# Patient Record
Sex: Female | Born: 1990
Health system: Southern US, Community
[De-identification: ages and names within clinical notes are randomized; demographics above are authoritative.]

## PROBLEM LIST (undated history)

## (undated) DIAGNOSIS — F419 Anxiety disorder, unspecified: Secondary | ICD-10-CM

## (undated) HISTORY — DX: Anxiety disorder, unspecified: F41.9

---

## 2009-07-28 ENCOUNTER — Emergency Department: Payer: Self-pay | Admitting: Emergency Medicine

## 2009-08-05 ENCOUNTER — Emergency Department: Payer: Self-pay | Admitting: Emergency Medicine

## 2009-08-20 ENCOUNTER — Ambulatory Visit: Payer: Self-pay | Admitting: Internal Medicine

## 2009-08-22 ENCOUNTER — Ambulatory Visit: Payer: Self-pay | Admitting: Orthopedic Surgery

## 2009-09-12 ENCOUNTER — Ambulatory Visit: Payer: Self-pay | Admitting: Internal Medicine

## 2010-01-01 ENCOUNTER — Emergency Department: Payer: Self-pay | Admitting: Emergency Medicine

## 2010-11-23 ENCOUNTER — Observation Stay: Payer: Self-pay

## 2010-11-24 ENCOUNTER — Inpatient Hospital Stay: Payer: Self-pay | Admitting: Obstetrics and Gynecology

## 2011-05-13 ENCOUNTER — Emergency Department: Payer: Self-pay | Admitting: Emergency Medicine

## 2011-05-13 LAB — PREGNANCY, URINE: Pregnancy Test, Urine: NEGATIVE m[IU]/mL

## 2013-07-07 DIAGNOSIS — Z3480 Encounter for supervision of other normal pregnancy, unspecified trimester: Secondary | ICD-10-CM | POA: Insufficient documentation

## 2013-07-07 HISTORY — DX: Encounter for supervision of other normal pregnancy, unspecified trimester: Z34.80

## 2013-12-04 DIAGNOSIS — O169 Unspecified maternal hypertension, unspecified trimester: Secondary | ICD-10-CM | POA: Insufficient documentation

## 2013-12-04 HISTORY — DX: Unspecified maternal hypertension, unspecified trimester: O16.9

## 2013-12-11 ENCOUNTER — Observation Stay: Payer: Self-pay

## 2013-12-13 ENCOUNTER — Inpatient Hospital Stay: Payer: Self-pay

## 2013-12-13 LAB — GC/CHLAMYDIA PROBE AMP

## 2013-12-13 LAB — CBC WITH DIFFERENTIAL/PLATELET
Basophil #: 0.1 10*3/uL (ref 0.0–0.1)
Basophil %: 0.6 %
Eosinophil #: 0.1 10*3/uL (ref 0.0–0.7)
Eosinophil %: 0.4 %
HCT: 35.8 % (ref 35.0–47.0)
HGB: 11.7 g/dL — ABNORMAL LOW (ref 12.0–16.0)
LYMPHS ABS: 1.7 10*3/uL (ref 1.0–3.6)
Lymphocyte %: 12.6 %
MCH: 26.7 pg (ref 26.0–34.0)
MCHC: 32.6 g/dL (ref 32.0–36.0)
MCV: 82 fL (ref 80–100)
Monocyte #: 1.2 x10 3/mm — ABNORMAL HIGH (ref 0.2–0.9)
Monocyte %: 9.1 %
NEUTROS ABS: 10.2 10*3/uL — AB (ref 1.4–6.5)
Neutrophil %: 77.3 %
Platelet: 256 10*3/uL (ref 150–440)
RBC: 4.37 10*6/uL (ref 3.80–5.20)
RDW: 14 % (ref 11.5–14.5)
WBC: 13.3 10*3/uL — ABNORMAL HIGH (ref 3.6–11.0)

## 2013-12-14 LAB — HEMATOCRIT: HCT: 31.5 % — ABNORMAL LOW (ref 35.0–47.0)

## 2014-08-13 ENCOUNTER — Ambulatory Visit: Payer: BLUE CROSS/BLUE SHIELD

## 2014-08-13 ENCOUNTER — Ambulatory Visit
Admission: EM | Admit: 2014-08-13 | Discharge: 2014-08-13 | Disposition: A | Discharge status: Home / Self Care | Payer: BLUE CROSS/BLUE SHIELD | Attending: Family Medicine | Admitting: Family Medicine

## 2014-08-13 DIAGNOSIS — S29009A Unspecified injury of muscle and tendon of unspecified wall of thorax, initial encounter: Secondary | ICD-10-CM

## 2014-08-13 DIAGNOSIS — Y929 Unspecified place or not applicable: Secondary | ICD-10-CM | POA: Diagnosis not present

## 2014-08-13 DIAGNOSIS — Z79899 Other long term (current) drug therapy: Secondary | ICD-10-CM | POA: Diagnosis not present

## 2014-08-13 DIAGNOSIS — S161XXA Strain of muscle, fascia and tendon at neck level, initial encounter: Secondary | ICD-10-CM

## 2014-08-13 DIAGNOSIS — S29012A Strain of muscle and tendon of back wall of thorax, initial encounter: Secondary | ICD-10-CM | POA: Diagnosis not present

## 2014-08-13 DIAGNOSIS — M25552 Pain in left hip: Secondary | ICD-10-CM | POA: Diagnosis present

## 2014-08-13 DIAGNOSIS — Y939 Activity, unspecified: Secondary | ICD-10-CM | POA: Insufficient documentation

## 2014-08-13 DIAGNOSIS — S76012A Strain of muscle, fascia and tendon of left hip, initial encounter: Secondary | ICD-10-CM | POA: Diagnosis not present

## 2014-08-13 DIAGNOSIS — M545 Low back pain: Secondary | ICD-10-CM | POA: Diagnosis present

## 2014-08-13 DIAGNOSIS — M542 Cervicalgia: Secondary | ICD-10-CM | POA: Diagnosis present

## 2014-08-13 DIAGNOSIS — S29019A Strain of muscle and tendon of unspecified wall of thorax, initial encounter: Secondary | ICD-10-CM

## 2014-08-13 LAB — PREGNANCY, URINE: Preg Test, Ur: NEGATIVE

## 2014-08-13 MED ORDER — ACETAMINOPHEN 500 MG PO TABS: 500.0000 mg | ORAL_TABLET | Freq: Four times a day (QID) | ORAL | Status: DC | PRN
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
## 2014-08-13 NOTE — Discharge Instructions (Signed)
Hip Pain Your hip is the joint between your upper legs and your lower pelvis. The bones, cartilage, tendons, and muscles of your hip joint perform a lot of work each day supporting your body weight and allowing you to move around. Hip pain can range from a minor ache to severe pain in one or both of your hips. Pain may be felt on the inside of the hip joint near the groin, or the outside near the buttocks and upper thigh. You may have swelling or stiffness as well.  HOME CARE INSTRUCTIONS   Take medicines only as directed by your health care provider.  Apply ice to the injured area:  Put ice in a plastic bag.  Place a towel between your skin and the bag.  Leave the ice on for 15-20 minutes at a time, 3-4 times a day.  Keep your leg raised (elevated) when possible to lessen swelling.  Avoid activities that cause pain.  Follow specific exercises as directed by your health care provider.  Sleep with a pillow between your legs on your most comfortable side.  Record how often you have hip pain, the location of the pain, and what it feels like. SEEK MEDICAL CARE IF:   You are unable to put weight on your leg.  Your hip is red or swollen or very tender to touch.  Your pain or swelling continues or worsens after 1 week.  You have increasing difficulty walking.  You have a fever. SEEK IMMEDIATE MEDICAL CARE IF:   You have fallen.  You have a sudden increase in pain and swelling in your hip. MAKE SURE YOU:   Understand these instructions.  Will watch your condition.  Will get help right away if you are not doing well or get worse. Document Released: 09/17/2009 Document Revised: 08/14/2013 Document Reviewed: 11/24/2012 [facility] Patient Information 2015 [place], [place]. This information is not intended to replace advice given to you by your health care provider. Make sure you discuss any questions you have with your health care provider. Thoracic Strain Thoracic strain is an  injury to the muscles of the upper back. A mild strain may take only 1 week to heal. Torn muscles or tendons may take 6 weeks to 2 months to heal. HOME CARE  Put ice on the injured area.  Put ice in a plastic bag.  Place a towel between your skin and the bag.  Leave the ice on for 15-20 minutes, 03-04 times a day, for the first 2 days.  Only take medicine as told by your doctor.  Go to physical therapy and perform exercises as told by your doctor.  Use wraps and back braces as told by your doctor.  Warm up before being active. GET HELP RIGHT AWAY IF:   There is more bruising, puffiness (swelling), or pain.  Medicine does not help the pain.  You have trouble breathing, chest pain, or a fever.  Your problems seem to be getting worse, not better. MAKE SURE YOU:   Understand these instructions.  Will watch your condition.  Will get help right away if you are not doing well or get worse. Document Released: 09/16/2007 Document Revised: 06/22/2011 Document Reviewed: 05/19/2010 [facility] Patient Information 2015 [place], [place]. This information is not intended to replace advice given to you by your health care provider. Make sure you discuss any questions you have with your health care provider. Cervical Strain and Sprain (Whiplash) with Rehab Cervical strain and sprain are injuries that commonly occur with "  whiplash" injuries. Whiplash occurs when the neck is forcefully whipped backward or forward, such as during a motor vehicle accident or during contact sports. The muscles, ligaments, tendons, discs, and nerves of the neck are susceptible to injury when this occurs. RISK FACTORS Risk of having a whiplash injury increases if:  Osteoarthritis of the spine.  Situations that make head or neck accidents or trauma more likely.  High-risk sports (football, rugby, wrestling, hockey, auto racing, gymnastics, diving, contact karate, or boxing).  Poor strength and flexibility of the  neck.  Previous neck injury.  Poor tackling technique.  Improperly fitted or padded equipment. SYMPTOMS   Pain or stiffness in the front or back of neck or both.  Symptoms may present immediately or up to 24 hours after injury.  Dizziness, headache, nausea, and vomiting.  Muscle spasm with soreness and stiffness in the neck.  Tenderness and swelling at the injury site. PREVENTION  Learn and use proper technique (avoid tackling with the head, spearing, and head-butting; use proper falling techniques to avoid landing on the head).  Warm up and stretch properly before activity.  Maintain physical fitness:  Strength, flexibility, and endurance.  Cardiovascular fitness.  Wear properly fitted and padded protective equipment, such as padded soft collars, for participation in contact sports. PROGNOSIS  Recovery from cervical strain and sprain injuries is dependent on the extent of the injury. These injuries are usually curable in 1 week to 3 months with appropriate treatment.  RELATED COMPLICATIONS   Temporary numbness and weakness may occur if the nerve roots are damaged, and this may persist until the nerve has completely healed.  Chronic pain due to frequent recurrence of symptoms.  Prolonged healing, especially if activity is resumed too soon (before complete recovery). TREATMENT  Treatment initially involves the use of ice and medication to help reduce pain and inflammation. It is also important to perform strengthening and stretching exercises and modify activities that worsen symptoms so the injury does not get worse. These exercises may be performed at home or with a therapist. For patients who experience severe symptoms, a soft, padded collar may be recommended to be worn around the neck.  Improving your posture may help reduce symptoms. Posture improvement includes pulling your chin and abdomen in while sitting or standing. If you are sitting, sit in a firm chair with your  buttocks against the back of the chair. While sleeping, try replacing your pillow with a small towel rolled to 2 inches in diameter, or use a cervical pillow or soft cervical collar. Poor sleeping positions delay healing.  For patients with nerve root damage, which causes numbness or weakness, the use of a cervical traction apparatus may be recommended. Surgery is rarely necessary for these injuries. However, cervical strain and sprains that are present at birth (congenital) may require surgery. MEDICATION   If pain medication is necessary, nonsteroidal anti-inflammatory medications, such as aspirin and ibuprofen, or other minor pain relievers, such as acetaminophen, are often recommended.  Do not take pain medication for 7 days before surgery.  Prescription pain relievers may be given if deemed necessary by your caregiver. Use only as directed and only as much as you need. HEAT AND COLD:   Cold treatment (icing) relieves pain and reduces inflammation. Cold treatment should be applied for 10 to 15 minutes every 2 to 3 hours for inflammation and pain and immediately after any activity that aggravates your symptoms. Use ice packs or an ice massage.  Heat treatment may be used prior  to performing the stretching and strengthening activities prescribed by your caregiver, physical therapist, or athletic trainer. Use a heat pack or a warm soak. SEEK MEDICAL CARE IF:   Symptoms get worse or do not improve in 2 weeks despite treatment.  New, unexplained symptoms develop (drugs used in treatment may produce side effects). EXERCISES RANGE OF MOTION (ROM) AND STRETCHING EXERCISES - Cervical Strain and Sprain These exercises may help you when beginning to rehabilitate your injury. In order to successfully resolve your symptoms, you must improve your posture. These exercises are designed to help reduce the forward-head and rounded-shoulder posture which contributes to this condition. Your symptoms may resolve  with or without further involvement from your physician, physical therapist or athletic trainer. While completing these exercises, remember:   Restoring tissue flexibility helps normal motion to return to the joints. This allows healthier, less painful movement and activity.  An effective stretch should be held for at least 20 seconds, although you may need to begin with shorter hold times for comfort.  A stretch should never be painful. You should only feel a gentle lengthening or release in the stretched tissue. STRETCH- Axial Extensors  Lie on your back on the floor. You may bend your knees for comfort. Place a rolled-up hand towel or dish towel, about 2 inches in diameter, under the part of your head that makes contact with the floor.  Gently tuck your chin, as if trying to make a "double chin," until you feel a gentle stretch at the base of your head.  Hold __________ seconds. Repeat __________ times. Complete this exercise __________ times per day.  STRETCH - Axial Extension   Stand or sit on a firm surface. Assume a good posture: chest up, shoulders drawn back, abdominal muscles slightly tense, knees unlocked (if standing) and feet hip width apart.  Slowly retract your chin so your head slides back and your chin slightly lowers. Continue to look straight ahead.  You should feel a gentle stretch in the back of your head. Be certain not to feel an aggressive stretch since this can cause headaches later.  Hold for __________ seconds. Repeat __________ times. Complete this exercise __________ times per day. STRETCH - Cervical Side Bend   Stand or sit on a firm surface. Assume a good posture: chest up, shoulders drawn back, abdominal muscles slightly tense, knees unlocked (if standing) and feet hip width apart.  Without letting your nose or shoulders move, slowly tip your right / left ear to your shoulder until your feel a gentle stretch in the muscles on the opposite side of your  neck.  Hold __________ seconds. Repeat __________ times. Complete this exercise __________ times per day. STRETCH - Cervical Rotators   Stand or sit on a firm surface. Assume a good posture: chest up, shoulders drawn back, abdominal muscles slightly tense, knees unlocked (if standing) and feet hip width apart.  Keeping your eyes level with the ground, slowly turn your head until you feel a gentle stretch along the back and opposite side of your neck.  Hold __________ seconds. Repeat __________ times. Complete this exercise __________ times per day. RANGE OF MOTION - Neck Circles   Stand or sit on a firm surface. Assume a good posture: chest up, shoulders drawn back, abdominal muscles slightly tense, knees unlocked (if standing) and feet hip width apart.  Gently roll your head down and around from the back of one shoulder to the back of the other. The motion should never be forced  or painful.  Repeat the motion 10-20 times, or until you feel the neck muscles relax and loosen. Repeat __________ times. Complete the exercise __________ times per day. STRENGTHENING EXERCISES - Cervical Strain and Sprain These exercises may help you when beginning to rehabilitate your injury. They may resolve your symptoms with or without further involvement from your physician, physical therapist, or athletic trainer. While completing these exercises, remember:   Muscles can gain both the endurance and the strength needed for everyday activities through controlled exercises.  Complete these exercises as instructed by your physician, physical therapist, or athletic trainer. Progress the resistance and repetitions only as guided.  You may experience muscle soreness or fatigue, but the pain or discomfort you are trying to eliminate should never worsen during these exercises. If this pain does worsen, stop and make certain you are following the directions exactly. If the pain is still present after adjustments,  discontinue the exercise until you can discuss the trouble with your clinician. STRENGTH - Cervical Flexors, Isometric  Face a wall, standing about 6 inches away. Place a small pillow, a ball about 6-8 inches in diameter, or a folded towel between your forehead and the wall.  Slightly tuck your chin and gently push your forehead into the soft object. Push only with mild to moderate intensity, building up tension gradually. Keep your jaw and forehead relaxed.  Hold 10 to 20 seconds. Keep your breathing relaxed.  Release the tension slowly. Relax your neck muscles completely before you start the next repetition. Repeat __________ times. Complete this exercise __________ times per day. STRENGTH- Cervical Lateral Flexors, Isometric   Stand about 6 inches away from a wall. Place a small pillow, a ball about 6-8 inches in diameter, or a folded towel between the side of your head and the wall.  Slightly tuck your chin and gently tilt your head into the soft object. Push only with mild to moderate intensity, building up tension gradually. Keep your jaw and forehead relaxed.  Hold 10 to 20 seconds. Keep your breathing relaxed.  Release the tension slowly. Relax your neck muscles completely before you start the next repetition. Repeat __________ times. Complete this exercise __________ times per day. STRENGTH - Cervical Extensors, Isometric   Stand about 6 inches away from a wall. Place a small pillow, a ball about 6-8 inches in diameter, or a folded towel between the back of your head and the wall.  Slightly tuck your chin and gently tilt your head back into the soft object. Push only with mild to moderate intensity, building up tension gradually. Keep your jaw and forehead relaxed.  Hold 10 to 20 seconds. Keep your breathing relaxed.  Release the tension slowly. Relax your neck muscles completely before you start the next repetition. Repeat __________ times. Complete this exercise __________  times per day. POSTURE AND BODY MECHANICS CONSIDERATIONS - Cervical Strain and Sprain Keeping correct posture when sitting, standing or completing your activities will reduce the stress put on different body tissues, allowing injured tissues a chance to heal and limiting painful experiences. The following are general guidelines for improved posture. Your physician or physical therapist will provide you with any instructions specific to your needs. While reading these guidelines, remember:  The exercises prescribed by your provider will help you have the flexibility and strength to maintain correct postures.  The correct posture provides the optimal environment for your joints to work. All of your joints have less wear and tear when properly supported by a spine  with good posture. This means you will experience a healthier, less painful body.  Correct posture must be practiced with all of your activities, especially prolonged sitting and standing. Correct posture is as important when doing repetitive low-stress activities (typing) as it is when doing a single heavy-load activity (lifting). PROLONGED STANDING WHILE SLIGHTLY LEANING FORWARD When completing a task that requires you to lean forward while standing in one place for a long time, place either foot up on a stationary 2- to 4-inch high object to help maintain the best posture. When both feet are on the ground, the low back tends to lose its slight inward curve. If this curve flattens (or becomes too large), then the back and your other joints will experience too much stress, fatigue more quickly, and can cause pain.  RESTING POSITIONS Consider which positions are most painful for you when choosing a resting position. If you have pain with flexion-based activities (sitting, bending, stooping, squatting), choose a position that allows you to rest in a less flexed posture. You would want to avoid curling into a fetal position on your side. If your  pain worsens with extension-based activities (prolonged standing, working overhead), avoid resting in an extended position such as sleeping on your stomach. Most people will find more comfort when they rest with their spine in a more neutral position, neither too rounded nor too arched. Lying on a non-sagging bed on your side with a pillow between your knees, or on your back with a pillow under your knees will often provide some relief. Keep in mind, being in any one position for a prolonged period of time, no matter how correct your posture, can still lead to stiffness. WALKING Walk with an upright posture. Your ears, shoulders, and hips should all line up. OFFICE WORK When working at a desk, create an environment that supports good, upright posture. Without extra support, muscles fatigue and lead to excessive strain on joints and other tissues. CHAIR:  A chair should be able to slide under your desk when your back makes contact with the back of the chair. This allows you to work closely.  The chair's height should allow your eyes to be level with the upper part of your monitor and your hands to be slightly lower than your elbows.  Body position:  Your feet should make contact with the floor. If this is not possible, use a foot rest.  Keep your ears over your shoulders. This will reduce stress on your neck and low back. Document Released: 03/30/2005 Document Revised: 08/14/2013 Document Reviewed: 07/12/2008 [facility] Patient Information 2015 [place], [place]. This information is not intended to replace advice given to you by your health care provider. Make sure you discuss any questions you have with your health care provider.

## 2014-08-21 NOTE — H&P (Signed)
L&D Evaluation:  History:  HPI 24 year old G2 P1001 with EDC=12/11/2013 by LMP=03/06/2013 and c/w a 20 wk6d ultrasound presents to L&D at 40 weeks with c/o spotting/ small amt bloody discharge after intercourse this AM. Having some mild ctxs. Baby active[facility]. [place] [facility] PNC remarkable for late onset, no prenatal visits from 25-34 weeks, and an elevated BP (141/79) at her 39 week visit with a negative PIH workup. LABS: O POS/RI/ h/o varicella/ one hr GTT=108/ GBS negative.  . TDAP given 10/31/2013. HepB AG, HIV, RPR negative.   Presents with postcoital spotting   Patient's Medical History No Chronic Illness   Patient's Surgical History none   Medications Pre Natal Vitamins   Allergies NKDA   Social History none   Family History Non-Contributory   ROS:  ROS see HPI   Exam:  Vital Signs stable  122/73, 103/59   Urine Protein not completed   General no apparent distress, sleeping thru ctxs   Mental Status clear   Abdomen tender with Leopolds LUS/ EFW8 1/2-9#   Estimated Fetal Weight Large for gestational age   Fetal Position cephalic   Pelvic no external lesions, small amt of blood tinged discharge on pad and glove. CX: closed/50%/-2 per RN exam   Mebranes Intact   FHT normal rate with no decels, 130 baseline with accels to 140s to 150s   FHT Description CAT1   Ucx irregular, initially had some frequent ctxs, many30 sec long, then spaced out and infrequent   Skin dry   Impression:  Impression IUP at 40 weeks, not in labor. Postcoital spotting   Plan:  Plan DC home with labor precautions. RTN to L&D for NST in 3 days if NIL. Tentatively scheduled for IOL 6 Sept PM for Cervidil ripening.   Electronic Signatures: Trinna BalloonGutierrez, Rayelynn Loyal L (CNM)  (Signed 31-Aug-15 07:18)  Authored: L&D Evaluation   Last Updated: 31-Aug-15 07:18 by  Trinna BalloonGutierrez, Alayjah Boehringer L (CNM)

## 2014-08-21 NOTE — H&P (Signed)
L&D Evaluation:  History Expanded:  HPI 24 yo G2P1 at term w mucus plug and back pains today.  [facility].  Has been seen on Labor and Delivery once before and Induction of labor in fact has been scheduled for postdates on Sept 6.   Gravida 2   Term 1   Presents with back pain   Patient's Medical History No Chronic Illness   Patient's Surgical History none   Medications Pre Natal Vitamins   Allergies NKDA   Social History none   Family History Non-Contributory   ROS:  ROS All systems were reviewed.  HEENT, CNS, GI, GU, Respiratory, CV, Renal and Musculoskeletal systems were found to be normal.   Exam:  Vital Signs stable   General no apparent distress   Mental Status clear   Abdomen gravid, non-tender   Estimated Fetal Weight Average for gestational age   Edema no edema   Pelvic 3/high   Mebranes Intact   FHT normal rate with no decels   Ucx irregular   Skin dry   Impression:  Impression early labor, reactive NST, vs false labor   Plan:  Plan EFM/NST, monitor contractions and for cervical change, fluids   Comments R Non-Stress Test   Electronic Signatures: Letitia LibraHarris, Robert Paul (MD)  (Signed 01-Sep-15 21:16)  Authored: L&D Evaluation   Last Updated: 01-Sep-15 21:16 by Letitia LibraHarris, Robert Paul (MD)

## 2015-04-14 NOTE — L&D Delivery Note (Signed)
At 2201 a viable and healthy female "Twin A" was delivered via OA presentation. APGAR 8;9; weight 6#0 oz.   At  2221 a viable and floppy female "twin B"was delivered via  (Presentation:footling breech ;  ).  APGAR:3 ,8 ; weight 6#5oz  .   Placenta status: delivered intact with 3 vessel Cord:  with the following complications:   Anesthesia:  epidural Episiotomy:  none Lacerations:  none Suture Repair: NA Est. Blood Loss (mL):  350  Dr Valentino Saxonherry in attendance.  Melody Plum BranchShambley, CNM

## 2015-11-06 LAB — OB RESULTS CONSOLE ANTIBODY SCREEN: Antibody Screen: NEGATIVE

## 2015-11-06 LAB — OB RESULTS CONSOLE HGB/HCT, BLOOD
HCT: 37 %
HEMOGLOBIN: 11.8 g/dL

## 2015-11-06 LAB — OB RESULTS CONSOLE ABO/RH: RH Type: POSITIVE

## 2015-11-06 LAB — OB RESULTS CONSOLE RUBELLA ANTIBODY, IGM: Rubella: IMMUNE

## 2015-11-06 LAB — OB RESULTS CONSOLE RPR: RPR: NONREACTIVE

## 2015-11-06 LAB — OB RESULTS CONSOLE HIV ANTIBODY (ROUTINE TESTING): HIV: NONREACTIVE

## 2015-11-06 LAB — OB RESULTS CONSOLE PLATELET COUNT: Platelets: 252 10*3/uL

## 2016-01-14 ENCOUNTER — Ambulatory Visit (INDEPENDENT_AMBULATORY_CARE_PROVIDER_SITE_OTHER): Payer: BLUE CROSS/BLUE SHIELD | Admitting: Obstetrics and Gynecology

## 2016-01-14 ENCOUNTER — Encounter: Payer: Self-pay | Admitting: Obstetrics and Gynecology

## 2016-01-14 VITALS — BP 117/63 | HR 88 | Wt 223.6 lb

## 2016-01-14 DIAGNOSIS — Z23 Encounter for immunization: Secondary | ICD-10-CM

## 2016-01-14 DIAGNOSIS — Z3492 Encounter for supervision of normal pregnancy, unspecified, second trimester: Secondary | ICD-10-CM

## 2016-01-14 DIAGNOSIS — O30049 Twin pregnancy, dichorionic/diamniotic, unspecified trimester: Secondary | ICD-10-CM

## 2016-01-14 LAB — POCT URINALYSIS DIPSTICK
Bilirubin, UA: NEGATIVE
Blood, UA: NEGATIVE
Glucose, UA: NEGATIVE
Ketones, UA: NEGATIVE
Leukocytes, UA: NEGATIVE
Nitrite, UA: NEGATIVE
PROTEIN UA: NEGATIVE
SPEC GRAV UA: 1.015
Urobilinogen, UA: 0.2
pH, UA: 7.5

## 2016-01-14 NOTE — Progress Notes (Signed)
Transfer OB-doing well, in nursing school FT and caring for other 2 kids (ages 5 & 2), happy about twins and pregnancy has been normal to date.

## 2016-01-14 NOTE — Progress Notes (Signed)
[facility] pt is doing well, denies any complaints, flu vaccine given

## 2016-01-28 ENCOUNTER — Ambulatory Visit (INDEPENDENT_AMBULATORY_CARE_PROVIDER_SITE_OTHER): Payer: BLUE CROSS/BLUE SHIELD | Admitting: Obstetrics and Gynecology

## 2016-01-28 VITALS — BP 114/64 | HR 88 | Wt 224.0 lb

## 2016-01-28 DIAGNOSIS — Z3492 Encounter for supervision of normal pregnancy, unspecified, second trimester: Secondary | ICD-10-CM

## 2016-01-28 LAB — POCT URINALYSIS DIPSTICK
Bilirubin, UA: NEGATIVE
Glucose, UA: NEGATIVE
KETONES UA: NEGATIVE
Leukocytes, UA: NEGATIVE
Nitrite, UA: NEGATIVE
PH UA: 6.5
PROTEIN UA: NEGATIVE
RBC UA: NEGATIVE
SPEC GRAV UA: 1.015
Urobilinogen, UA: 0.2

## 2016-01-28 NOTE — Progress Notes (Signed)
ROB- pt is doing well, denies any complaints 

## 2016-01-29 ENCOUNTER — Encounter: Payer: Self-pay | Admitting: Obstetrics and Gynecology

## 2016-02-06 ENCOUNTER — Other Ambulatory Visit: Payer: Self-pay | Admitting: *Deleted

## 2016-02-06 DIAGNOSIS — Z131 Encounter for screening for diabetes mellitus: Secondary | ICD-10-CM

## 2016-02-07 ENCOUNTER — Other Ambulatory Visit: Payer: Self-pay | Admitting: Obstetrics and Gynecology

## 2016-02-07 DIAGNOSIS — Z3689 Encounter for other specified antenatal screening: Principal | ICD-10-CM

## 2016-02-07 DIAGNOSIS — O30009 Twin pregnancy, unspecified number of placenta and unspecified number of amniotic sacs, unspecified trimester: Secondary | ICD-10-CM

## 2016-02-12 ENCOUNTER — Other Ambulatory Visit: Payer: BLUE CROSS/BLUE SHIELD

## 2016-02-12 ENCOUNTER — Ambulatory Visit (INDEPENDENT_AMBULATORY_CARE_PROVIDER_SITE_OTHER): Payer: BLUE CROSS/BLUE SHIELD | Admitting: Obstetrics and Gynecology

## 2016-02-12 ENCOUNTER — Ambulatory Visit (INDEPENDENT_AMBULATORY_CARE_PROVIDER_SITE_OTHER): Payer: BLUE CROSS/BLUE SHIELD

## 2016-02-12 VITALS — BP 123/67 | HR 84 | Wt 225.1 lb

## 2016-02-12 DIAGNOSIS — Z3689 Encounter for other specified antenatal screening: Secondary | ICD-10-CM

## 2016-02-12 DIAGNOSIS — Z23 Encounter for immunization: Secondary | ICD-10-CM | POA: Diagnosis not present

## 2016-02-12 DIAGNOSIS — Z3492 Encounter for supervision of normal pregnancy, unspecified, second trimester: Secondary | ICD-10-CM | POA: Diagnosis not present

## 2016-02-12 DIAGNOSIS — O30009 Twin pregnancy, unspecified number of placenta and unspecified number of amniotic sacs, unspecified trimester: Secondary | ICD-10-CM

## 2016-02-12 DIAGNOSIS — Z131 Encounter for screening for diabetes mellitus: Secondary | ICD-10-CM

## 2016-02-12 DIAGNOSIS — Z3493 Encounter for supervision of normal pregnancy, unspecified, third trimester: Secondary | ICD-10-CM | POA: Diagnosis not present

## 2016-02-12 LAB — POCT URINALYSIS DIPSTICK
Bilirubin, UA: NEGATIVE
Blood, UA: NEGATIVE
Glucose, UA: NEGATIVE
Ketones, UA: NEGATIVE
LEUKOCYTES UA: NEGATIVE
NITRITE UA: NEGATIVE
PH UA: 7
Spec Grav, UA: 1.01
UROBILINOGEN UA: 0.2

## 2016-02-12 MED ORDER — TETANUS-DIPHTH-ACELL PERTUSSIS 5-2.5-18.5 LF-MCG/0.5 IM SUSP
0.5000 mL | Freq: Once | INTRAMUSCULAR | Status: AC
  Administered 2016-02-12: 0.5 mL via INTRAMUSCULAR

## 2016-02-12 NOTE — Patient Instructions (Signed)

## 2016-02-12 NOTE — Progress Notes (Signed)
ROB- blood consent signed, glucola done, tdap given Pt is having some bruising and doesn't remember hitting anything- legs

## 2016-02-12 NOTE — Progress Notes (Signed)
ROB- will add CBC and vit D to labs, counseled on delivery and cord blood donation bs banking, will consider. Indications: Growth and AFI for Di-Di Twins Findings:   TWIN A FHR at 168 BPM. Biometrics give an (U/S) Gestational age of [redacted] weeks and 6 days, and an (U/S) EDD of 04/30/16; this correlates with the clinically established EDD of 05/02/16.  Fetal presentation is vertex, spine on maternal right.  EFW: 1197 grams ( 2 lbs. 10 oz. ) 43rd percentile, Williams Placenta: Posterior, grade 1.  AFI: Adequate with a MVP of 4.1 cm.  Anatomic survey of the fetal stomach, bladder and kidneys appears WNL. Gender: Female.   TWIN B FHR at 169 BPM. Biometrics give an (U/S) Gestational age of [redacted] weeks and 6 days, and an (U/S) EDD of 04/30/16; this correlates with the clinically established EDD of 05/02/16.  Fetal presentation is vertex, spine on maternal left.  EFW: 1293 grams ( 2 lbs. 14 oz. ) 51st percentile, Williams Placenta: Anterior, grade 1.  AFI: Adequate with a MVP of 4.1 cm.   Impression: 1. 28 weeks, 6 days Viable Di-Di twin Intrauterine pregnancy by U/S. 2. (U/S) EDD is consistent with Clinically established (LMP) EDD of 05/02/16. 3. EFW and AFI appear WNL. See notes on each fetus above.

## 2016-02-14 ENCOUNTER — Other Ambulatory Visit: Payer: Self-pay | Admitting: Obstetrics and Gynecology

## 2016-02-14 MED ORDER — FUSION PLUS PO CAPS: 1.0000 | ORAL_CAPSULE | Freq: Every day | ORAL | 1 refills | Status: DC

## 2016-02-17 ENCOUNTER — Other Ambulatory Visit: Payer: Self-pay | Admitting: Obstetrics and Gynecology

## 2016-02-18 LAB — HEMOGLOBIN AND HEMATOCRIT, BLOOD
HEMATOCRIT: 30.4 % — AB (ref 34.0–46.6)
HEMOGLOBIN: 10.4 g/dL — AB (ref 11.1–15.9)

## 2016-02-18 LAB — VARICELLA ZOSTER ANTIBODY, IGG

## 2016-02-18 LAB — GLUCOSE, 1 HOUR GESTATIONAL: GESTATIONAL DIABETES SCREEN: 114 mg/dL (ref 65–139)

## 2016-02-18 LAB — VITAMIN D 25 HYDROXY (VIT D DEFICIENCY, FRACTURES): Vit D, 25-Hydroxy: 27.6 ng/mL — ABNORMAL LOW (ref 30.0–100.0)

## 2016-02-28 ENCOUNTER — Ambulatory Visit (INDEPENDENT_AMBULATORY_CARE_PROVIDER_SITE_OTHER): Payer: BLUE CROSS/BLUE SHIELD | Admitting: Obstetrics and Gynecology

## 2016-02-28 VITALS — BP 125/60 | HR 88 | Wt 229.9 lb

## 2016-02-28 DIAGNOSIS — Z3493 Encounter for supervision of normal pregnancy, unspecified, third trimester: Secondary | ICD-10-CM

## 2016-02-28 DIAGNOSIS — O2213 Genital varices in pregnancy, third trimester: Secondary | ICD-10-CM | POA: Insufficient documentation

## 2016-02-28 LAB — POCT URINALYSIS DIPSTICK
Bilirubin, UA: NEGATIVE
Blood, UA: NEGATIVE
GLUCOSE UA: NEGATIVE
Ketones, UA: NEGATIVE
Leukocytes, UA: NEGATIVE
Nitrite, UA: NEGATIVE
Protein, UA: NEGATIVE
Spec Grav, UA: <=1.005
Urobilinogen, UA: 0.2
pH, UA: 7

## 2016-02-28 NOTE — Progress Notes (Signed)
ROB- pt is having some varicose veins that are painful, some cramping more on her R side

## 2016-02-28 NOTE — Progress Notes (Signed)
ROB

## 2016-03-03 ENCOUNTER — Encounter: Payer: BLUE CROSS/BLUE SHIELD | Admitting: Obstetrics and Gynecology

## 2016-03-04 ENCOUNTER — Other Ambulatory Visit: Payer: Self-pay | Admitting: Obstetrics and Gynecology

## 2016-03-04 DIAGNOSIS — Z3689 Encounter for other specified antenatal screening: Principal | ICD-10-CM

## 2016-03-04 DIAGNOSIS — O30009 Twin pregnancy, unspecified number of placenta and unspecified number of amniotic sacs, unspecified trimester: Secondary | ICD-10-CM

## 2016-03-11 ENCOUNTER — Ambulatory Visit (INDEPENDENT_AMBULATORY_CARE_PROVIDER_SITE_OTHER): Payer: BLUE CROSS/BLUE SHIELD | Admitting: Obstetrics and Gynecology

## 2016-03-11 ENCOUNTER — Ambulatory Visit (INDEPENDENT_AMBULATORY_CARE_PROVIDER_SITE_OTHER): Payer: BLUE CROSS/BLUE SHIELD

## 2016-03-11 VITALS — BP 101/63 | HR 78 | Wt 231.8 lb

## 2016-03-11 DIAGNOSIS — O99013 Anemia complicating pregnancy, third trimester: Secondary | ICD-10-CM

## 2016-03-11 DIAGNOSIS — O30043 Twin pregnancy, dichorionic/diamniotic, third trimester: Secondary | ICD-10-CM

## 2016-03-11 DIAGNOSIS — Z3689 Encounter for other specified antenatal screening: Secondary | ICD-10-CM

## 2016-03-11 DIAGNOSIS — O30009 Twin pregnancy, unspecified number of placenta and unspecified number of amniotic sacs, unspecified trimester: Secondary | ICD-10-CM

## 2016-03-11 DIAGNOSIS — K5900 Constipation, unspecified: Secondary | ICD-10-CM

## 2016-03-11 DIAGNOSIS — O99613 Diseases of the digestive system complicating pregnancy, third trimester: Secondary | ICD-10-CM

## 2016-03-14 DIAGNOSIS — O99013 Anemia complicating pregnancy, third trimester: Secondary | ICD-10-CM | POA: Insufficient documentation

## 2016-03-14 DIAGNOSIS — O30043 Twin pregnancy, dichorionic/diamniotic, third trimester: Secondary | ICD-10-CM | POA: Insufficient documentation

## 2016-03-14 NOTE — Progress Notes (Signed)
ROB: Notes constipation from iron tablets, prescribed Colace, discussed increasing fiber and water intake.  S/p growth scan with normal growth x 2, growth discordance 6.5%, Twin A vertex, Twin B breech.  Grade 2/3 placentas x 2. Discussion had on delivery plan for twin gestation. Will plan for vaginal delivery as Twin A is vertex.  Discussed possible internal version vs need for C-section for 2nd twin.  RTC in 2 weeks. For final growth scan in 4 weeks. .Marland Kitchen

## 2016-03-16 ENCOUNTER — Telehealth: Payer: Self-pay | Admitting: Obstetrics and Gynecology

## 2016-03-16 NOTE — Telephone Encounter (Signed)
PT CALLED AND SHE HAS REALLY BAD ACID REFLUX TO A POINT TAHT SHE IS CHOKING, SHE HAS TRIED MYLANTA AND TUMS, SHE WANTED TO KNOW IF THERE WAS SOMETHING THAT COULD BE CALLED IN FOR HER OR IF SHE NEEDS TO TRY ANOTHER OTC MED. SHE USES [facility] [address]

## 2016-03-17 ENCOUNTER — Other Ambulatory Visit: Payer: Self-pay | Admitting: Obstetrics and Gynecology

## 2016-03-17 MED ORDER — RANITIDINE HCL 150 MG PO TABS: 150.0000 mg | ORAL_TABLET | Freq: Two times a day (BID) | ORAL | 4 refills | Status: DC

## 2016-03-17 NOTE — Telephone Encounter (Signed)
pls advise

## 2016-03-17 NOTE — Telephone Encounter (Signed)
Please let her know I sent in RX for zantac 2 x day.

## 2016-03-26 ENCOUNTER — Ambulatory Visit (INDEPENDENT_AMBULATORY_CARE_PROVIDER_SITE_OTHER): Payer: BLUE CROSS/BLUE SHIELD | Admitting: Obstetrics and Gynecology

## 2016-03-26 VITALS — BP 126/68 | HR 75 | Wt 238.5 lb

## 2016-03-26 DIAGNOSIS — Z3493 Encounter for supervision of normal pregnancy, unspecified, third trimester: Secondary | ICD-10-CM

## 2016-03-26 DIAGNOSIS — Z3685 Encounter for antenatal screening for Streptococcus B: Secondary | ICD-10-CM

## 2016-03-26 LAB — POCT URINALYSIS DIPSTICK
Bilirubin, UA: NEGATIVE
Glucose, UA: NEGATIVE
Ketones, UA: NEGATIVE
Leukocytes, UA: NEGATIVE
NITRITE UA: NEGATIVE
PH UA: 6.5
Protein, UA: NEGATIVE
RBC UA: NEGATIVE
Spec Grav, UA: 1.015
Urobilinogen, UA: 0.2

## 2016-03-26 NOTE — Progress Notes (Signed)
ROB- doing well; GBS cultures obtained.labor precautions discussed

## 2016-03-26 NOTE — Addendum Note (Signed)
Addended by: Rosine BeatLONTZ, Loriene Taunton L on: 03/26/2016 02:18 PM   Modules accepted: Orders

## 2016-03-26 NOTE — Progress Notes (Signed)
ROB- pt is having some morning sickness, is doing well denies any complaints

## 2016-03-28 LAB — STREP GP B NAA: Strep Gp B NAA: NEGATIVE

## 2016-04-02 ENCOUNTER — Ambulatory Visit (INDEPENDENT_AMBULATORY_CARE_PROVIDER_SITE_OTHER): Payer: BLUE CROSS/BLUE SHIELD | Admitting: Certified Nurse Midwife

## 2016-04-02 VITALS — BP 112/68 | HR 91 | Wt 239.1 lb

## 2016-04-02 DIAGNOSIS — Z3493 Encounter for supervision of normal pregnancy, unspecified, third trimester: Secondary | ICD-10-CM

## 2016-04-02 DIAGNOSIS — Z113 Encounter for screening for infections with a predominantly sexual mode of transmission: Secondary | ICD-10-CM

## 2016-04-02 LAB — POCT URINALYSIS DIPSTICK
Bilirubin, UA: NEGATIVE
GLUCOSE UA: NEGATIVE
Ketones, UA: NEGATIVE
Leukocytes, UA: NEGATIVE
NITRITE UA: NEGATIVE
PROTEIN UA: NEGATIVE
RBC UA: NEGATIVE
Spec Grav, UA: 1.01
UROBILINOGEN UA: 0.2
pH, UA: 6.5

## 2016-04-02 NOTE — Progress Notes (Addendum)
ROB-Pt doing well. No concerns. Discussed "spinning babies". Reviewed red flag symptoms and when to call. GC/CH collected. RTC x 1 wks for growth scan and ROB.

## 2016-04-02 NOTE — Progress Notes (Signed)
ROB- pt is doing well,denies any new complaints

## 2016-04-08 ENCOUNTER — Other Ambulatory Visit: Payer: Self-pay | Admitting: Obstetrics and Gynecology

## 2016-04-09 ENCOUNTER — Inpatient Hospital Stay
Admission: EM | Admit: 2016-04-09 | Discharge: 2016-04-12 | DRG: 775 | Disposition: A | Discharge status: Home / Self Care | Payer: BLUE CROSS/BLUE SHIELD | Attending: Obstetrics and Gynecology | Admitting: Obstetrics and Gynecology

## 2016-04-09 DIAGNOSIS — Z3A36 36 weeks gestation of pregnancy: Secondary | ICD-10-CM

## 2016-04-09 DIAGNOSIS — O42013 Preterm premature rupture of membranes, onset of labor within 24 hours of rupture, third trimester: Principal | ICD-10-CM | POA: Diagnosis present

## 2016-04-09 DIAGNOSIS — O30043 Twin pregnancy, dichorionic/diamniotic, third trimester: Secondary | ICD-10-CM | POA: Diagnosis present

## 2016-04-09 DIAGNOSIS — O321XX2 Maternal care for breech presentation, fetus 2: Secondary | ICD-10-CM | POA: Diagnosis present

## 2016-04-09 DIAGNOSIS — Z833 Family history of diabetes mellitus: Secondary | ICD-10-CM

## 2016-04-09 DIAGNOSIS — IMO0002 Reserved for concepts with insufficient information to code with codable children: Secondary | ICD-10-CM

## 2016-04-09 LAB — CBC
HCT: 33.2 % — ABNORMAL LOW (ref 35.0–47.0)
Hemoglobin: 11.1 g/dL — ABNORMAL LOW (ref 12.0–16.0)
MCH: 27.1 pg (ref 26.0–34.0)
MCHC: 33.6 g/dL (ref 32.0–36.0)
MCV: 80.8 fL (ref 80.0–100.0)
PLATELETS: 267 10*3/uL (ref 150–440)
RBC: 4.11 MIL/uL (ref 3.80–5.20)
RDW: 14 % (ref 11.5–14.5)
WBC: 10.2 10*3/uL (ref 3.6–11.0)

## 2016-04-09 LAB — TYPE AND SCREEN
ABO/RH(D): O POS
ANTIBODY SCREEN: NEGATIVE

## 2016-04-09 MED ORDER — LACTATED RINGERS IV SOLN
INTRAVENOUS | Status: DC
  Administered 2016-04-09 – 2016-04-10 (×5): via INTRAVENOUS

## 2016-04-09 MED ORDER — OXYTOCIN 40 UNITS IN LACTATED RINGERS INFUSION - SIMPLE MED: 2.5000 [IU]/h | INTRAVENOUS | Status: DC

## 2016-04-09 MED ORDER — ONDANSETRON HCL 4 MG/2ML IJ SOLN
4.0000 mg | Freq: Four times a day (QID) | INTRAMUSCULAR | Status: DC | PRN
  Administered 2016-04-10: 4 mg via INTRAVENOUS
  Filled 2016-04-09: qty 2

## 2016-04-09 MED ORDER — FLEET ENEMA 7-19 GM/118ML RE ENEM: 1.0000 | ENEMA | RECTAL | Status: DC | PRN

## 2016-04-09 MED ORDER — OXYCODONE-ACETAMINOPHEN 5-325 MG PO TABS: 1.0000 | ORAL_TABLET | ORAL | Status: DC | PRN

## 2016-04-09 MED ORDER — LACTATED RINGERS IV SOLN
500.0000 mL | INTRAVENOUS | Status: DC | PRN
  Administered 2016-04-10: 500 mL via INTRAVENOUS

## 2016-04-09 MED ORDER — ACETAMINOPHEN 325 MG PO TABS: 650.0000 mg | ORAL_TABLET | ORAL | Status: DC | PRN

## 2016-04-09 MED ORDER — OXYCODONE-ACETAMINOPHEN 5-325 MG PO TABS: 2.0000 | ORAL_TABLET | ORAL | Status: DC | PRN

## 2016-04-09 MED ORDER — SOD CITRATE-CITRIC ACID 500-334 MG/5ML PO SOLN
30.0000 mL | ORAL | Status: DC | PRN
  Administered 2016-04-09: 30 mL via ORAL
  Filled 2016-04-09: qty 30

## 2016-04-09 MED ORDER — OXYTOCIN BOLUS FROM INFUSION: 500.0000 mL | Freq: Once | INTRAVENOUS | Status: DC

## 2016-04-09 MED ORDER — LIDOCAINE HCL (PF) 1 % IJ SOLN: 30.0000 mL | INTRAMUSCULAR | Status: DC | PRN

## 2016-04-09 NOTE — Progress Notes (Signed)
Patient complaining of leaking fluid since 0030 on 04/09/2016. Denies feeling contractions, says she feels occasional tightness. Endorses fetal movement. Melody Aura CampsShambley, CNM notified.

## 2016-04-09 NOTE — H&P (Signed)
Obstetric History and Physical  Julie Powers is a 25 y.o. [id number] with IUP at 8167w5d presenting with leaking fluid since 0030-clear. Patient states she has been having  regular, every 2-3 minutes contractions, none vaginal bleeding, ruptured, clear fluid, NTZ +, with active fetal movement.    [facility]  Pregnancy complications or risks:twin gestation boy/girl  Prenatal labs and studies: ABO, Rh: --/--/O POS (12/28 0150) Antibody: NEG (12/28 0150) Rubella: Immune (07/26 0000) RPR: Nonreactive (07/26 0000)  HBsAg:    HIV: Non-reactive (07/26 0000)  AVW:UJWJXBJYGBS:Negative (12/14 1434) 1 hr Glucola  normal Genetic screening normal Anatomy US normal  Past Medical History:  Diagnosis Date  . Anxiety     History reviewed. No pertinent surgical history.  OB History  Gravida Para Term Preterm AB Living  3 2 2     2   SAB TAB Ectopic Multiple Live Births          2    # Outcome Date GA Lbr Len/2nd Weight Sex Delivery Anes PTL Lv  3 Current           2 Term 2015 6336w2d   M Vag-Spont  N LIV  1 Term 2012 2733w4d   F Vag-Spont  N LIV      Social History   Social History  . Marital status: Married    Spouse name: N/A  . Number of children: N/A  . Years of education: N/A   Social History Main Topics  . Smoking status: Never Smoker  . Smokeless tobacco: Never Used  . Alcohol use Yes  . Drug use: No  . Sexual activity: Yes   Other Topics Concern  . None   Social History Narrative  . None    Family History  Problem Relation Age of Onset  . Diabetes Mother   . Cancer Maternal Grandfather     lung    Prescriptions Prior to Admission  Medication Sig Dispense Refill Last Dose  . acetaminophen (TYLENOL) 500 MG tablet Take 1 tablet (500 mg total) by mouth every 6 (six) hours as needed. (Patient not taking: Reported on 04/09/2016) 30 tablet 0 Not Taking at Unknown time  . Iron-FA-B Cmp-C-Biot-Probiotic (FUSION PLUS) CAPS Take 1 capsule by mouth daily.  (Patient not taking: Reported on 04/09/2016) 60 capsule 1 Not Taking at Unknown time  . Prenatal Vit-Fe Fumarate-FA (PRENATAL MULTIVITAMIN) TABS tablet Take 1 tablet by mouth daily at 12 noon.   Not Taking at Unknown time  . ranitidine (ZANTAC) 150 MG tablet Take 1 tablet (150 mg total) by mouth 2 (two) times daily. (Patient not taking: Reported on 04/09/2016) 60 tablet 4 Not Taking at Unknown time    No Known Allergies  Review of Systems: Negative except for what is mentioned in HPI.  Physical Exam: BP 112/66   Pulse 99   Temp 98.1 F (36.7 C) (Oral)   Resp 18   Ht 5\' 10"  (1.778 m)   Wt 239 lb (108.4 kg)   LMP 07/27/2015   BMI 34.29 kg/m  GENERAL: Well-developed, well-nourished female in no acute distress.  LUNGS: Clear to auscultation bilaterally.  HEART: Regular rate and rhythm. ABDOMEN: Soft, nontender, nondistended, gravid. EXTREMITIES: Nontender, no edema, 2+ distal pulses. Cervical Exam: Dilation: 1.5 Effacement (%): 50 Station: -3 Exam by:: Alfonzo BeersMolly Trott, RN  FHT:  Baseline rate 130s for both bpm   Variability moderate  Accelerations present   Decelerations none Contractions: Every 2-3 mins, mild to palpation   Pertinent  Labs/Studies:   Results for orders placed or performed during the hospital encounter of 04/09/16 (from the past 24 hour(s))  CBC     Status: Abnormal   Collection Time: 04/09/16  1:50 AM  Result Value Ref Range   WBC 10.2 3.6 - 11.0 K/uL   RBC 4.11 3.80 - 5.20 MIL/uL   Hemoglobin 11.1 (L) 12.0 - 16.0 g/dL   HCT 72.533.2 (L) 36.635.0 - 44.047.0 %   MCV 80.8 80.0 - 100.0 fL   MCH 27.1 26.0 - 34.0 pg   MCHC 33.6 32.0 - 36.0 g/dL   RDW 34.714.0 42.511.5 - 95.614.5 %   Platelets 267 150 - 440 K/uL  Type and screen [facility]     Status: None   Collection Time: 04/09/16  1:50 AM  Result Value Ref Range   ABO/RH(D) O POS    Antibody Screen NEG    Sample Expiration 04/12/2016     Assessment : Julie Peandria A Cubero is a 25 y.o. G3P2002 at 4650w5d being  admitted for labor. Twin pregnancy with PSROM  Plan: Labor: Expectant management.  Induction/Augmentation as needed, per protocol FWB: Reassuring fetal heart tracing.  GBS negative Delivery plan: Hopeful for vaginal delivery  Melody Shambley, CNM [facility]

## 2016-04-09 NOTE — Progress Notes (Signed)
Galen ManilaM. Shambley CNM called to clarify plan of care. Plan to allow patient to rest overnight with intermittent fetal heart tones q 4 hours.  Start Pitocin if no changes have occurred in labor status by 6am.  May start Pitocin sooner if patient desires.  Notify CNM for change in labor status.

## 2016-04-09 NOTE — Plan of Care (Signed)
Discussed plan of care with patient.  Patient would like to rest through the night.  Patient agrees to intermittent monitoring q 4 hours and agrees to start Pitocin in the morning if no change noted in labor progress.

## 2016-04-09 NOTE — Progress Notes (Signed)
Julie Powers is a 25 y.o. G3P2002 at 2835w5d by LMP admitted for PROM, twin gestation  Subjective:   Objective: BP 114/76   Pulse 89   Temp 98.1 F (36.7 C) (Oral)   Resp 18   Ht 5\' 10"  (1.778 m)   Wt 239 lb (108.4 kg)   LMP 07/27/2015   BMI 34.29 kg/m  No intake/output data recorded. Total I/O In: 1465.4 [P.O.:780; I.V.:685.4] Out: -   FHT:  FHR: 135/140 bpm, variability: moderate,  accelerations:  Present,  decelerations:  Absent UC:   irregular, every 2-4 minutes, mild to palpation SVE:   Dilation: 1.5 Effacement (%): 50 Station: -3 Exam by:: Alfonzo BeersMolly Trott, RN   Labs: Lab Results  Component Value Date   WBC 10.2 04/09/2016   HGB 11.1 (L) 04/09/2016   HCT 33.2 (L) 04/09/2016   MCV 80.8 04/09/2016   PLT 267 04/09/2016    Assessment / Plan: SPROM with prodromal early labor Ambulating, desires not using pitocin, will continue expectant managment Labor: no change in intensity to date Preeclampsia:  labs stable Fetal Wellbeing:  Category I Pain Control:  Labor support without medications I/D:  n/a Anticipated MOD:  NSVD  Treyvion Durkee N Kierstan Auer 04/09/2016, 2:14 PM

## 2016-04-10 ENCOUNTER — Inpatient Hospital Stay: Payer: BLUE CROSS/BLUE SHIELD | Admitting: Anesthesiology

## 2016-04-10 ENCOUNTER — Ambulatory Visit: Payer: BLUE CROSS/BLUE SHIELD

## 2016-04-10 ENCOUNTER — Encounter: Payer: BLUE CROSS/BLUE SHIELD | Admitting: Obstetrics and Gynecology

## 2016-04-10 DIAGNOSIS — O30043 Twin pregnancy, dichorionic/diamniotic, third trimester: Secondary | ICD-10-CM

## 2016-04-10 LAB — CBC WITH DIFFERENTIAL/PLATELET
BASOS PCT: 1 %
Basophils Absolute: 0 10*3/uL (ref 0–0.1)
Eosinophils Absolute: 0.2 10*3/uL (ref 0–0.7)
Eosinophils Relative: 2 %
HEMATOCRIT: 30.3 % — AB (ref 35.0–47.0)
HEMOGLOBIN: 10.2 g/dL — AB (ref 12.0–16.0)
LYMPHS ABS: 1.1 10*3/uL (ref 1.0–3.6)
LYMPHS PCT: 16 %
MCH: 27.1 pg (ref 26.0–34.0)
MCHC: 33.6 g/dL (ref 32.0–36.0)
MCV: 80.6 fL (ref 80.0–100.0)
MONOS PCT: 10 %
Monocytes Absolute: 0.7 10*3/uL (ref 0.2–0.9)
NEUTROS ABS: 5.2 10*3/uL (ref 1.4–6.5)
NEUTROS PCT: 71 %
Platelets: 232 10*3/uL (ref 150–440)
RBC: 3.76 MIL/uL — ABNORMAL LOW (ref 3.80–5.20)
RDW: 13.9 % (ref 11.5–14.5)
WBC: 7.2 10*3/uL (ref 3.6–11.0)

## 2016-04-10 LAB — RPR: RPR: NONREACTIVE

## 2016-04-10 MED ORDER — NALBUPHINE HCL 10 MG/ML IJ SOLN: 5.0000 mg | Freq: Once | INTRAMUSCULAR | Status: DC | PRN

## 2016-04-10 MED ORDER — AMMONIA AROMATIC IN INHA
RESPIRATORY_TRACT | Status: AC
  Filled 2016-04-10: qty 10

## 2016-04-10 MED ORDER — SODIUM CHLORIDE 0.9 % IV SOLN
INTRAVENOUS | Status: DC | PRN
  Administered 2016-04-10 (×3): 5 mL via EPIDURAL

## 2016-04-10 MED ORDER — SODIUM CHLORIDE 0.9% FLUSH: 3.0000 mL | INTRAVENOUS | Status: DC | PRN

## 2016-04-10 MED ORDER — OXYTOCIN 10 UNIT/ML IJ SOLN
INTRAMUSCULAR | Status: AC
  Filled 2016-04-10: qty 2

## 2016-04-10 MED ORDER — ONDANSETRON HCL 4 MG/2ML IJ SOLN: 4.0000 mg | Freq: Three times a day (TID) | INTRAMUSCULAR | Status: DC | PRN

## 2016-04-10 MED ORDER — LIDOCAINE HCL (PF) 1 % IJ SOLN
INTRAMUSCULAR | Status: AC
  Filled 2016-04-10: qty 30

## 2016-04-10 MED ORDER — NALBUPHINE HCL 10 MG/ML IJ SOLN: 5.0000 mg | INTRAMUSCULAR | Status: DC | PRN

## 2016-04-10 MED ORDER — FENTANYL 2.5 MCG/ML W/ROPIVACAINE 0.2% IN NS 100 ML EPIDURAL INFUSION (ARMC-ANES)
EPIDURAL | Status: DC | PRN
  Administered 2016-04-10: 10 mL/h via EPIDURAL

## 2016-04-10 MED ORDER — OXYTOCIN 40 UNITS IN LACTATED RINGERS INFUSION - SIMPLE MED
1.0000 m[IU]/min | INTRAVENOUS | Status: DC
  Administered 2016-04-10: 1 m[IU]/min via INTRAVENOUS
  Filled 2016-04-10: qty 1000

## 2016-04-10 MED ORDER — DIPHENHYDRAMINE HCL 50 MG/ML IJ SOLN: 12.5000 mg | INTRAMUSCULAR | Status: DC | PRN

## 2016-04-10 MED ORDER — MISOPROSTOL 200 MCG PO TABS
ORAL_TABLET | ORAL | Status: AC
  Filled 2016-04-10: qty 4

## 2016-04-10 MED ORDER — SODIUM CHLORIDE 0.9 % IV SOLN
2.0000 g | Freq: Once | INTRAVENOUS | Status: AC
  Administered 2016-04-10: 2 g via INTRAVENOUS
  Filled 2016-04-10 (×2): qty 2000

## 2016-04-10 MED ORDER — NALOXONE HCL 2 MG/2ML IJ SOSY
1.0000 ug/kg/h | PREFILLED_SYRINGE | INTRAVENOUS | Status: DC | PRN
  Filled 2016-04-10: qty 2

## 2016-04-10 MED ORDER — SCOPOLAMINE 1 MG/3DAYS TD PT72: 1.0000 | MEDICATED_PATCH | Freq: Once | TRANSDERMAL | Status: DC

## 2016-04-10 MED ORDER — FENTANYL 2.5 MCG/ML W/ROPIVACAINE 0.2% IN NS 100 ML EPIDURAL INFUSION (ARMC-ANES): 10.0000 mL/h | EPIDURAL | Status: DC

## 2016-04-10 MED ORDER — ACETAMINOPHEN 10 MG/ML IV SOLN
1000.0000 mg | Freq: Once | INTRAVENOUS | Status: AC
  Administered 2016-04-10: 1000 mg via INTRAVENOUS
  Filled 2016-04-10: qty 100

## 2016-04-10 MED ORDER — TERBUTALINE SULFATE 1 MG/ML IJ SOLN: 0.2500 mg | Freq: Once | INTRAMUSCULAR | Status: DC | PRN

## 2016-04-10 MED ORDER — FENTANYL 2.5 MCG/ML W/ROPIVACAINE 0.2% IN NS 100 ML EPIDURAL INFUSION (ARMC-ANES)
EPIDURAL | Status: AC
  Filled 2016-04-10: qty 100

## 2016-04-10 MED ORDER — LIDOCAINE-EPINEPHRINE (PF) 1.5 %-1:200000 IJ SOLN
INTRAMUSCULAR | Status: DC | PRN
  Administered 2016-04-10: 3 mL

## 2016-04-10 MED ORDER — NALOXONE HCL 0.4 MG/ML IJ SOLN: 0.4000 mg | INTRAMUSCULAR | Status: DC | PRN

## 2016-04-10 MED ORDER — DIPHENHYDRAMINE HCL 25 MG PO CAPS: 25.0000 mg | ORAL_CAPSULE | ORAL | Status: DC | PRN

## 2016-04-10 MED ORDER — MEPERIDINE HCL 25 MG/ML IJ SOLN
6.2500 mg | INTRAMUSCULAR | Status: DC | PRN
  Administered 2016-04-10: 6.25 mg via INTRAVENOUS
  Filled 2016-04-10: qty 1

## 2016-04-10 MED ORDER — NITROGLYCERIN 0.4 MG/SPRAY TL SOLN
Status: AC
  Filled 2016-04-10: qty 4.9

## 2016-04-10 NOTE — Progress Notes (Signed)
Julie Powers is a 25 y.o. G3P2002 at 5980w6d by LMP admitted for PROM, twin gestation  Subjective: Denies pain at this time  Objective: BP 119/79 (BP Location: Right Arm)   Pulse 96   Temp 98.4 F (36.9 C) (Oral)   Resp 18   Ht 5\' 10"  (1.778 m)   Wt 239 lb (108.4 kg)   LMP 07/27/2015   BMI 34.29 kg/m  I/O last 3 completed shifts: In: 3282.1 [P.O.:1680; I.V.:1602.1] Out: -  No intake/output data recorded.  FHT:  FHR: 135/140 bpm, variability: moderate,  accelerations:  Present,  decelerations:  Absent UC:   irregular, every 5-6 minutes SVE:   Dilation: 1.5 Effacement (%): 50 Station: -3 [facility]:: [facility]  Labs: Lab Results  Component Value Date   WBC 10.2 04/09/2016   HGB 11.1 (L) 04/09/2016   HCT 33.2 (L) 04/09/2016   MCV 80.8 04/09/2016   PLT 267 04/09/2016    Assessment / Plan: Protracted latent phase  Labor: just started pitocin Preeclampsia:  labs stable Fetal Wellbeing:  Category I Pain Control:  Labor support without medications I/D:  n/a Anticipated MOD:  NSVD  Julie Powers 04/10/2016, 8:09 AM

## 2016-04-10 NOTE — Progress Notes (Signed)
Alwyn Peandria A Vittitow is a 25 y.o. G3P2002 at 7712w6d by LMP admitted for PROM  Subjective: Reports back pain with contractions, some better since epidural placed  Objective: BP 138/77   Pulse 91   Temp 98.9 F (37.2 C) (Oral)   Resp 18   Ht 5\' 10"  (1.778 m)   Wt 239 lb (108.4 kg)   LMP 07/27/2015   SpO2 98%   BMI 34.29 kg/m  I/O last 3 completed shifts: In: 3282.1 [P.O.:1680; I.V.:1602.1] Out: -  No intake/output data recorded.  FHT:  FHR: 141/130 bpm, variability: moderate,  accelerations:  Present,  decelerations:  Absent UC:   regular, every 2-3 minutes SVE:   Dilation: 7 Effacement (%): 80 Station: -1, 0 Exam by:: Hurshel PartyE. Rivera, RN  IUPC and foley placed  Labs: Lab Results  Component Value Date   WBC 7.2 04/10/2016   HGB 10.2 (L) 04/10/2016   HCT 30.3 (L) 04/10/2016   MCV 80.6 04/10/2016   PLT 232 04/10/2016    Assessment / Plan: Augmentation of labor, progressing well  Labor: Progressing normally Preeclampsia:  labs stable Fetal Wellbeing:  Category I Pain Control:  Epidural I/D:  n/a Anticipated MOD:  NSVD  Donnalee Cellucci N Jesilyn Easom 04/10/2016, 6:39 PM

## 2016-04-10 NOTE — Anesthesia Preprocedure Evaluation (Signed)
Anesthesia Evaluation  Patient identified by MRN, date of birth, ID band Patient awake    Reviewed: Allergy & Precautions, NPO status , Patient's Chart, lab work & pertinent test results  History of Anesthesia Complications Negative for: history of anesthetic complications  Airway Mallampati: II  TM Distance: >3 FB Neck ROM: Full    Dental no notable dental hx.    Pulmonary neg pulmonary ROS, neg sleep apnea, neg COPD,    breath sounds clear to auscultation- rhonchi (-) wheezing      Cardiovascular Exercise Tolerance: Good (-) hypertension(-) CAD and (-) Past MI  Rhythm:Regular Rate:Normal - Systolic murmurs and - Diastolic murmurs    Neuro/Psych Anxiety negative neurological ROS     GI/Hepatic negative GI ROS, Neg liver ROS,   Endo/Other  negative endocrine ROSneg diabetes  Renal/GU negative Renal ROS     Musculoskeletal negative musculoskeletal ROS (+)   Abdominal (+) + obese, Gravid abdomen  Peds  Hematology  (+) anemia ,   Anesthesia Other Findings didi twin IUP  Reproductive/Obstetrics (+) Pregnancy                             Anesthesia Physical Anesthesia Plan  ASA: II  Anesthesia Plan: Epidural   Post-op Pain Management:    Induction:   Airway Management Planned:   Additional Equipment:   Intra-op Plan:   Post-operative Plan:   Informed Consent: I have reviewed the patients History and Physical, chart, labs and discussed the procedure including the risks, benefits and alternatives for the proposed anesthesia with the patient or authorized representative who has indicated his/her understanding and acceptance.     Plan Discussed with: Anesthesiologist  Anesthesia Plan Comments: (Plan for epidural for labor, discussed epidural vs spinal vs GA if need for csection)        Lab Results  Component Value Date   WBC 7.2 04/10/2016   HGB 10.2 (L) 04/10/2016   HCT  30.3 (L) 04/10/2016   MCV 80.6 04/10/2016   PLT 232 04/10/2016    Anesthesia Quick Evaluation

## 2016-04-10 NOTE — Progress Notes (Signed)
Patient shivering, medication administered, will recheck BP

## 2016-04-10 NOTE — Progress Notes (Signed)
Alwyn Peandria A Kooyman is a 25 y.o. G3P2002 at 7023w6d by LMP admitted for Preterm labor with twin pregnancy  Subjective: Rates pain tolerable at this time  Objective: BP 138/89   Pulse 86   Temp 98.1 F (36.7 C) (Oral)   Resp 18   Ht 5\' 10"  (1.778 m)   Wt 239 lb (108.4 kg)   LMP 07/27/2015   BMI 34.29 kg/m  I/O last 3 completed shifts: In: 3282.1 [P.O.:1680; I.V.:1602.1] Out: -  No intake/output data recorded.  FHT:  FHR: 144/135 bpm, variability: moderate,  accelerations:  Present,  decelerations:  Absent UC:   regular, every 2-3 minutes, mild to palpation, on 528mu/min pitocin SVE:   Dilation: 3 Effacement (%): 50 Station: -2 Exam by:: M.Shambley, CNM  Labs: Lab Results  Component Value Date   WBC 7.2 04/10/2016   HGB 10.2 (L) 04/10/2016   HCT 30.3 (L) 04/10/2016   MCV 80.6 04/10/2016   PLT 232 04/10/2016    Assessment / Plan: Augmentation of labor, progressing well FSE placed on baby A  Labor: Progressing normally Preeclampsia:  labs stable Fetal Wellbeing:  Category I Pain Control:  Labor support without medications I/D:  n/a Anticipated MOD:  NSVD  Melody N Shambley 04/10/2016, 1:26 PM

## 2016-04-10 NOTE — Anesthesia Procedure Notes (Signed)
Epidural Patient location during procedure: OB Start time: 04/10/2016 5:59 PM End time: 04/10/2016 6:19 PM  Staffing Anesthesiologist: Alver FisherPENWARDEN, Delvis Kau Performed: anesthesiologist   Preanesthetic Checklist Completed: patient identified, site marked, surgical consent, pre-op evaluation, timeout performed, IV checked, risks and benefits discussed and monitors and equipment checked  Epidural Patient position: sitting Prep: ChloraPrep Patient monitoring: heart rate, continuous pulse ox and blood pressure Approach: midline Location: L4-L5 Injection technique: LOR saline  Needle:  Needle type: Tuohy  Needle gauge: 18 G Needle length: 9 cm and 9 Needle insertion depth: 6 cm Catheter type: closed end flexible Catheter size: 20 Guage Catheter at skin depth: 10 cm Test dose: negative (0.125% bupivacaine)  Assessment Events: blood not aspirated, injection not painful, no injection resistance, negative IV test and no paresthesia  Additional Notes   Patient tolerated the insertion well without complications.Reason for block:procedure for pain

## 2016-04-10 NOTE — Progress Notes (Signed)
Julie Powers is a 25 y.o. G3P2002 at 5455w6d by LMP admitted for PROM & twin gestation  Subjective: Reports pressure and headache  Objective: BP (!) 141/82   Pulse (!) 154   Temp (!) 102.1 F (38.9 C) (Axillary)   Resp 18   Ht 5\' 10"  (1.778 m)   Wt 239 lb (108.4 kg)   LMP 07/27/2015   SpO2 100%   BMI 34.29 kg/m  I/O last 3 completed shifts: In: 3282.1 [P.O.:1680; I.V.:1602.1] Out: -  No intake/output data recorded.  FHT:  FHR: 200/174 bpm, variability: moderate,  accelerations:  Present,  decelerations:  Absent UC:   regular, every 2 minutes SVE:   Dilation: 9 Effacement (%): 80 Station: -1, 0 Exam by:: Julie Powers  Labs: Lab Results  Component Value Date   WBC 7.2 04/10/2016   HGB 10.2 (L) 04/10/2016   HCT 30.3 (L) 04/10/2016   MCV 80.6 04/10/2016   PLT 232 04/10/2016    Assessment / Plan: Augmentation of labor, progressing well, febrile -  Labor: Progressing normally Preeclampsia:  labs stable Fetal Wellbeing:  Category II Pain Control:  Epidural I/D:  n/a Anticipated MOD:  NSVD  Julie Powers 04/10/2016, 9:27 PM

## 2016-04-10 NOTE — Progress Notes (Signed)
Galen ManilaM. Shambley CNM notified of pitocin not yet started due to loss of IV access. Updated on FHT's, uterine activity, vaginal exam, and afebrile. Plan to start Pitocin when IV access obtained.

## 2016-04-11 LAB — CBC
HCT: 30.6 % — ABNORMAL LOW (ref 35.0–47.0)
HEMOGLOBIN: 10 g/dL — AB (ref 12.0–16.0)
MCH: 26.5 pg (ref 26.0–34.0)
MCHC: 32.7 g/dL (ref 32.0–36.0)
MCV: 81 fL (ref 80.0–100.0)
PLATELETS: 224 10*3/uL (ref 150–440)
RBC: 3.78 MIL/uL — AB (ref 3.80–5.20)
RDW: 13.7 % (ref 11.5–14.5)
WBC: 23.1 10*3/uL — ABNORMAL HIGH (ref 3.6–11.0)

## 2016-04-11 LAB — GC/CHLAMYDIA PROBE AMP
Chlamydia trachomatis, NAA: NEGATIVE
Neisseria gonorrhoeae by PCR: NEGATIVE

## 2016-04-11 MED ORDER — COCONUT OIL OIL
1.0000 | TOPICAL_OIL | Status: DC | PRN
Start: 2016-04-11 — End: 2016-04-12
  Administered 2016-04-12: 1 via TOPICAL
  Filled 2016-04-11: qty 120

## 2016-04-11 MED ORDER — DIPHENHYDRAMINE HCL 25 MG PO CAPS: 25.0000 mg | ORAL_CAPSULE | Freq: Four times a day (QID) | ORAL | Status: DC | PRN

## 2016-04-11 MED ORDER — ONDANSETRON HCL 4 MG/2ML IJ SOLN: 4.0000 mg | INTRAMUSCULAR | Status: DC | PRN

## 2016-04-11 MED ORDER — IBUPROFEN 600 MG PO TABS
600.0000 mg | ORAL_TABLET | Freq: Four times a day (QID) | ORAL | Status: DC
  Administered 2016-04-12: 600 mg via ORAL
  Filled 2016-04-11 (×2): qty 1

## 2016-04-11 MED ORDER — OXYTOCIN 40 UNITS IN LACTATED RINGERS INFUSION - SIMPLE MED
INTRAVENOUS | Status: AC
  Filled 2016-04-11: qty 1000

## 2016-04-11 MED ORDER — OXYCODONE HCL 5 MG PO TABS: 10.0000 mg | ORAL_TABLET | ORAL | Status: DC | PRN

## 2016-04-11 MED ORDER — ONDANSETRON HCL 4 MG PO TABS: 4.0000 mg | ORAL_TABLET | ORAL | Status: DC | PRN

## 2016-04-11 MED ORDER — WITCH HAZEL-GLYCERIN EX PADS: 1.0000 "application " | MEDICATED_PAD | CUTANEOUS | Status: DC | PRN

## 2016-04-11 MED ORDER — LIDOCAINE HCL (PF) 1 % IJ SOLN
INTRAMUSCULAR | Status: AC
  Filled 2016-04-11: qty 30

## 2016-04-11 MED ORDER — BENZOCAINE-MENTHOL 20-0.5 % EX AERO: 1.0000 "application " | INHALATION_SPRAY | CUTANEOUS | Status: DC | PRN

## 2016-04-11 MED ORDER — SENNOSIDES-DOCUSATE SODIUM 8.6-50 MG PO TABS: 2.0000 | ORAL_TABLET | ORAL | Status: DC

## 2016-04-11 MED ORDER — SIMETHICONE 80 MG PO CHEW: 80.0000 mg | CHEWABLE_TABLET | ORAL | Status: DC | PRN

## 2016-04-11 MED ORDER — MISOPROSTOL 200 MCG PO TABS
ORAL_TABLET | ORAL | Status: AC
  Filled 2016-04-11: qty 4

## 2016-04-11 MED ORDER — AMMONIA AROMATIC IN INHA
RESPIRATORY_TRACT | Status: AC
  Filled 2016-04-11: qty 10

## 2016-04-11 MED ORDER — DIBUCAINE 1 % RE OINT: 1.0000 "application " | TOPICAL_OINTMENT | RECTAL | Status: DC | PRN

## 2016-04-11 MED ORDER — OXYTOCIN 10 UNIT/ML IJ SOLN
INTRAMUSCULAR | Status: AC
  Filled 2016-04-11: qty 2

## 2016-04-11 MED ORDER — ACETAMINOPHEN 325 MG PO TABS
650.0000 mg | ORAL_TABLET | ORAL | Status: DC | PRN
  Administered 2016-04-12: 650 mg via ORAL
  Filled 2016-04-11: qty 2

## 2016-04-11 MED ORDER — PRENATAL MULTIVITAMIN CH
1.0000 | ORAL_TABLET | Freq: Every day | ORAL | Status: DC
Start: 2016-04-11 — End: 2016-04-12
  Filled 2016-04-11: qty 1

## 2016-04-11 MED ORDER — OXYCODONE HCL 5 MG PO TABS
5.0000 mg | ORAL_TABLET | ORAL | Status: DC | PRN
  Administered 2016-04-12: 5 mg via ORAL
  Filled 2016-04-11: qty 1

## 2016-04-11 NOTE — Progress Notes (Signed)
Post Partum Day 1 Subjective: no complaints, up ad lib and voiding  Objective: Blood pressure (!) 97/58, pulse 75, temperature 97.8 F (36.6 C), temperature source Oral, resp. rate 18, height 5\' 10"  (1.778 m), weight 239 lb (108.4 kg), last menstrual period 07/27/2015, SpO2 99 %, unknown if currently breastfeeding.  Physical Exam:  General: alert, cooperative, appears stated age and fatigued Lochia: appropriate Uterine Fundus: firm Incision: NA DVT Evaluation: No evidence of DVT seen on physical exam. Negative Homan's sign. Calf/Ankle edema is present.   Recent Labs  04/10/16 0839 04/11/16 0647  HGB 10.2* 10.0*  HCT 30.3* 30.6*    Assessment/Plan: Plan for discharge tomorrow and Breastfeeding   LOS: 2 days   Melody N Shambley 04/11/2016, 11:31 AM

## 2016-04-11 NOTE — Anesthesia Postprocedure Evaluation (Signed)
Anesthesia Post Note  Patient: Julie Powers  Procedure(s) Performed: * No procedures listed *  Patient location during evaluation: Mother Baby Anesthesia Type: Epidural Level of consciousness: awake and alert Pain management: pain level controlled Vital Signs Assessment: post-procedure vital signs reviewed and stable Respiratory status: spontaneous breathing, nonlabored ventilation and respiratory function stable Cardiovascular status: stable Postop Assessment: no headache, no backache and epidural receding Anesthetic complications: no     Last Vitals:  Vitals:   04/11/16 0813 04/11/16 1137  BP: (!) 97/58 (!) 104/52  Pulse: 75 81  Resp: 18 18  Temp: 36.6 C 36.6 C    Last Pain:  Vitals:   04/11/16 1549  TempSrc:   PainSc: 0-No pain                 Jaylon Boylen K

## 2016-04-12 LAB — CBC WITH DIFFERENTIAL/PLATELET
Basophils Absolute: 0 10*3/uL (ref 0–0.1)
Basophils Relative: 0 %
EOS ABS: 0.3 10*3/uL (ref 0–0.7)
EOS PCT: 2 %
HCT: 31 % — ABNORMAL LOW (ref 35.0–47.0)
HEMOGLOBIN: 10.2 g/dL — AB (ref 12.0–16.0)
LYMPHS ABS: 2.2 10*3/uL (ref 1.0–3.6)
Lymphocytes Relative: 12 %
MCH: 26.8 pg (ref 26.0–34.0)
MCHC: 32.8 g/dL (ref 32.0–36.0)
MCV: 81.8 fL (ref 80.0–100.0)
MONO ABS: 1.5 10*3/uL — AB (ref 0.2–0.9)
MONOS PCT: 9 %
NEUTROS PCT: 77 %
Neutro Abs: 13.9 10*3/uL — ABNORMAL HIGH (ref 1.4–6.5)
Platelets: 242 10*3/uL (ref 150–440)
RBC: 3.79 MIL/uL — ABNORMAL LOW (ref 3.80–5.20)
RDW: 14.1 % (ref 11.5–14.5)
WBC: 17.9 10*3/uL — ABNORMAL HIGH (ref 3.6–11.0)

## 2016-04-12 MED ORDER — VITAMIN D3 125 MCG (5000 UT) PO CAPS: 1.0000 | ORAL_CAPSULE | Freq: Every day | ORAL | 2 refills | Status: DC

## 2016-04-12 NOTE — Discharge Summary (Signed)
OB Discharge Summary     Patient Name: Julie Powers DOB: May 24, 1990 MRN: [id number]  Date of admission: 04/09/2016 Delivering MD:    Gwyneth RevelsYoung, Baby Boy A [[id number]][[id number]]  Jaclyn PrimeSHAMBLEY, Ahuva Poynor N   Mohr, BG B [[id number]][[id number]]  Harlow MaresSHAMBLEY, Braeden Kennan N   Date of discharge: 04/12/2016  Admitting diagnosis: 36 weeks water broke Intrauterine pregnancy: 3572w6d     Secondary diagnosis:  Active Problems:   Rupture of membranes with clear amniotic fluid  Additional problems: twin gestation     Discharge diagnosis: Preterm Pregnancy Delivered and twin vaginal birth                                                                                                Post partum procedures:NA  Augmentation: Pitocin  Complications: ROM>24 hours  Hospital course:  Onset of Labor With Vaginal Delivery     25 y.o. yo [id number] at 9272w6d was admitted in Latent Labor on 04/09/2016. Patient had an uncomplicated labor course as follows:  Membrane Rupture Time/Date:    Gwyneth RevelsYoung, Baby Boy A [[id number]][[id number]]  12:30 AM   Maple HudsonYoung, BG B [[id number]][[id number]]  10:19 PM ,   Gwyneth RevelsYoung, Baby Boy A [[id number]][[id number]]  04/09/2016   Steve, BG B [[id number]][[id number]]  04/10/2016   Intrapartum Procedures: Episiotomy:    Gwyneth RevelsYoung, Baby Boy A [[id number]][[id number]]  None [1]   Guidry, BG B [[id number]][[id number]]  None [1]                                         Lacerations:     Gwyneth RevelsYoung, Baby Boy A [[id number]][[id number]]  None [1]   Sabater, BG B [[id number]][[id number]]  None [1]  Patient had a delivery of a Viable infant.   Gwyneth RevelsYoung, Baby Boy A [[id number]][[id number]]  04/10/2016   Nada MaclachlanYoung, BG B [[id number]][[id number]]  04/10/2016 Information for the patient's newborn:  Gwyneth RevelsYoung, Baby Boy A [[id number]][[id number]]  Delivery Method: Vaginal, Spontaneous Delivery (Filed from Delivery Summary) Information for the patient's newborn:  Parlier, BG B [[id number]][[id number]]  Delivery Method: Vag-Spont    Pateint had an uncomplicated postpartum course.  She is ambulating, tolerating a regular diet, passing flatus, and urinating well. Patient is discharged home  in stable condition on 04/12/16.    Physical exam Vitals:   04/11/16 2009 04/11/16 2320 04/12/16 0418 04/12/16 0802  BP: (!) 107/52  (!) 113/53 109/68  Pulse: 76  70 69  Resp: 18  18 18   Temp: 98.5 F (36.9 C) 98.3 F (36.8 C) 98.3 F (36.8 C) 98.2 F (36.8 C)  TempSrc: Oral Oral Oral Oral  SpO2: 100%  98%   Weight:      Height:       General: alert, cooperative and no distress Lochia: appropriate Uterine Fundus: firm Incision: N/A DVT Evaluation: No evidence of DVT seen on physical exam. Negative Homan's sign. Calf/Ankle edema is present Labs: Lab Results  Component Value Date   WBC 17.9 (H) 04/12/2016   HGB 10.2 (L) 04/12/2016   HCT 31.0 (L) 04/12/2016   MCV 81.8  04/12/2016   PLT 242 04/12/2016   No flowsheet data found.  Discharge instruction: per After Visit Summary and "Baby and Me Booklet".  After visit meds:  Allergies as of 04/12/2016   No Known Allergies     Medication List    STOP taking these medications   ranitidine 150 MG tablet Commonly known as:  ZANTAC     TAKE these medications   acetaminophen 500 MG tablet Commonly known as:  TYLENOL Take 1 tablet (500 mg total) by mouth every 6 (six) hours as needed.   FUSION PLUS Caps Take 1 capsule by mouth daily.   prenatal multivitamin Tabs tablet Take 1 tablet by mouth daily at 12 noon.   Vitamin D3 5000 units Caps Take 1 capsule (5,000 Units total) by mouth daily.       Diet: routine diet  Activity: Advance as tolerated. Pelvic rest for 6 weeks.   Outpatient follow up:6 weeks Follow up Appt:No future appointments. Follow up Visit:No Follow-up on file.  Postpartum contraception: Vasectomy  Newborn Data:   Gwyneth RevelsYoung, Baby Boy A [[id number]][[id number]]  Live born female  Birth Weight: 5 lb 15.9 oz (2720 g) APGAR: ,    Maple HudsonYoung, BG B [[id number]][[id number]]  Live born female  Birth Weight: 6 lb 5.4 oz (2875 g) APGAR: ,   Baby Feeding: Breast Disposition:home with mother   04/12/2016 Purcell NailsMelody N Pat Sires,  CNM

## 2016-04-12 NOTE — Progress Notes (Signed)
D/C order from MD.  Reviewed d/c instructions and prescriptions with patient and answered any questions.  Patient d/c home with infant via wheelchair by nursing/auxillary. 

## 2016-04-12 NOTE — Discharge Instructions (Signed)
Please call your doctor or return to the ER if you experience any chest pains, shortness of breath, fever greater than 101, any heavy bleeding or large clots, and foul smelling vaginal discharge, any worsening abdominal pain & cramping that is not controlled by pain medication, or any signs of post partum depression.  No tampons, enemas, douches, or sexual intercourse for 6 weeks.  Also avoid tub baths, hot tubs, or swimming for 6 weeks.   Home Care Instructions for Mom Introduction  ACTIVITY  Gradually return to your regular activities.  Let yourself rest. Nap while your baby sleeps.  Avoid lifting anything that is heavier than 10 lb (4.5 kg) until your health care provider says it is okay.  Avoid activities that take a lot of effort and energy (are strenuous) until approved by your health care provider. Walking at a slow-to-moderate pace is usually safe.  If you had a cesarean delivery:  Do not vacuum, climb stairs, or drive a car for 4-6 weeks.  Have someone help you at home until you feel like you can do your usual activities yourself.  Do exercises as told by your health care provider, if this applies. VAGINAL BLEEDING You may continue to bleed for 4-6 weeks after delivery. Over time, the amount of blood usually decreases and the color of the blood usually gets lighter. However, the flow of bright red blood may increase if you have been too active. If you need to use more than one pad in an hour because your pad gets soaked, or if you pass a large clot:  Lie down.  Raise your feet.  Place a cold compress on your lower abdomen.  Rest.  Call your health care provider. If you are breastfeeding, your period should return anytime between 8 weeks after delivery and the time that you stop breastfeeding. If you are not breastfeeding, your period should return 6-8 weeks after delivery. PERINEAL CARE The perineal area, or perineum, is the part of your body between your thighs. After  delivery, this area needs special care. Follow these instructions as told by your health care provider.  Take warm tub baths for 15-20 minutes.  Use medicated pads and pain-relieving sprays and creams as told.  Do not use tampons or douches until vaginal bleeding has stopped.  Each time you go to the bathroom:  Use a peri bottle.  Change your pad.  Use towelettes in place of toilet paper until your stitches have healed.  Do Kegel exercises every day. Kegel exercises help to maintain the muscles that support the vagina, bladder, and bowels. You can do these exercises while you are standing, sitting, or lying down. To do Kegel exercises:  Tighten the muscles of your abdomen and the muscles that surround your birth canal.  Hold for a few seconds.  Relax.  Repeat until you have done this 5 times in a row.  To prevent hemorrhoids from developing or getting worse:  Drink enough fluid to keep your urine clear or pale yellow.  Avoid straining when having a bowel movement.  Take over-the-counter medicines and stool softeners as told by your health care provider. BREAST CARE  Wear a tight-fitting bra.  Avoid taking over-the-counter pain medicine for breast discomfort.  Apply ice to the breasts to help with discomfort as needed:  Put ice in a plastic bag.  Place a towel between your skin and the bag.  Leave the ice on for 20 minutes or as told by your health care provider. NUTRITION  Eat a well-balanced diet.  Do not try to lose weight quickly by cutting back on calories.  Take your prenatal vitamins until your postpartum checkup or until your health care provider tells you to stop. POSTPARTUM DEPRESSION You may find yourself crying for no apparent reason and unable to cope with all of the changes that come with having a newborn. This mood is called postpartum depression. Postpartum depression happens because your hormone levels change after delivery. If you have postpartum  depression, get support from your partner, friends, and family. If the depression does not go away on its own after several weeks, contact your health care provider. BREAST SELF-EXAM Do a breast self-exam each month, at the same time of the month. If you are breastfeeding, check your breasts just after a feeding, when your breasts are less full. If you are breastfeeding and your period has started, check your breasts on day 5, 6, or 7 of your period. Report any lumps, bumps, or discharge to your health care provider. Know that breasts are normally lumpy if you are breastfeeding. This is temporary, and it is not a health risk. INTIMACY AND SEXUALITY Avoid sexual activity for at least 3-4 weeks after delivery or until the brownish-red vaginal flow is completely gone. If you want to avoid pregnancy, use some form of birth control. You can get pregnant after delivery, even if you have not had your period. SEEK MEDICAL CARE IF:  You feel unable to cope with the changes that a child brings to your life, and these feelings do not go away after several weeks.  You notice a lump, a bump, or discharge on your breast. SEEK IMMEDIATE MEDICAL CARE IF:  Blood soaks your pad in 1 hour or less.  You have:  Severe pain or cramping in your lower abdomen.  A bad-smelling vaginal discharge.  A fever that is not controlled by medicine.  A fever, and an area of your breast is red and sore.  Pain or redness in your calf.  Sudden, severe chest pain.  Shortness of breath.  Painful or bloody urination.  Problems with your vision.  You vomit for 12 hours or longer.  You develop a severe headache.  You have serious thoughts about hurting yourself, your child, or anyone else. This information is not intended to replace advice given to you by your health care provider. Make sure you discuss any questions you have with your health care provider. Document Released: 03/27/2000 Document Revised: 09/05/2015  Document Reviewed: 10/01/2014  2017 Elsevier

## 2016-04-15 LAB — SURGICAL PATHOLOGY
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
## 2016-05-22 ENCOUNTER — Encounter: Payer: Self-pay | Admitting: Certified Nurse Midwife

## 2016-05-22 ENCOUNTER — Ambulatory Visit (INDEPENDENT_AMBULATORY_CARE_PROVIDER_SITE_OTHER): Payer: BLUE CROSS/BLUE SHIELD | Admitting: Certified Nurse Midwife

## 2016-05-22 NOTE — Patient Instructions (Signed)
Breast Pumping Tips  If you are breastfeeding, there may be times when you cannot feed your baby directly. Returning to work or going on a trip are examples. Pumping allows you to store breast milk and feed it to your baby later.  You may not get much milk when you first start to pump. Your breasts should start to make more after a few days. If you pump at the times you usually feed your baby, you may be able to keep making enough milk to feed your baby without also using formula. The more often you pump, the more milk your body will make.  When should I pump?  · You can start to pump soon after you have your baby. Ask your doctor what is right for you and your baby.  · If you are going back to work, start pumping a few weeks before. This gives you time to learn how to pump and to store a supply of milk.  · When you are with your baby, feed your baby when he or she is hungry. Pump after each feeding.  · When you are away from your baby for many hours, pump for about 15 minutes every 2-3 hours. Pump both breasts at the same time if you can.  · If your baby has a formula feeding, make sure to pump close to the same time.  · If you drink any alcohol, wait 2 hours before pumping.  How do I get ready to pump?  Your let-down reflex is your body's natural reaction that makes your breast milk flow. It is easier to make your breast milk flow when you are relaxed. Try these things to help you relax:  · Smell one of your baby's blankets or an item of clothing.  · Look at a picture or video of your baby.  · Sit in a quiet, private space.  · Massage the breast you plan to pump.  · Place soothing warmth on the breast.  · Play relaxing music.    What are some breast pumping tips?  · Wash your hands before you pump. You do not need to wash your nipples or breasts.  · There are three ways to pump. You can:  ? Use your hand to massage and squeeze your breast.  ? Use a handheld manual pump.  ? Use an electric pump.  · Make sure the  suction cup on the breast pump is the right size. Place the suction cup directly over the nipple. It can be painful or hurt your nipple if it is the wrong size or placed wrong.  · Put a small amount of purified or modified lanolin on your nipple and areola if you are sore.  · If you are using an electric pump, change the speed and suction power to be more comfortable.  · You may need a different type of pump if pumping hurts or you do not get a lot of milk. Your doctor can help you pick what type of pump to use.  · Keep a full water bottle near you always. Drinking lots of fluid helps you make more milk.  · You can store your milk to use later. Pumped breast milk can be stored in a sealable, sterile container or plastic bag. Always put the date you pumped it on the container.  ? Milk can stay out at room temperature for up to 8 hours.  ? You can store your milk in the refrigerator for up to   8 days.  ? You can store your milk in the freezer for 3 months. Thaw frozen milk using warm water. Do not put it in the microwave.  · Do not smoke. Ask your doctor for help.  When should I call my doctor?  · You have a hard time pumping.  · You are worried you do not make enough milk.  · You have nipple pain, soreness, or redness.  · You want to take birth control pills.  This information is not intended to replace advice given to you by your health care provider. Make sure you discuss any questions you have with your health care provider.  Document Released: 09/16/2007 Document Revised: 09/05/2015 Document Reviewed: 01/20/2013  Elsevier Interactive Patient Education © 2017 [employer] Inc[employer]

## 2016-05-22 NOTE — Progress Notes (Signed)
Subjective:    Alwyn Peandria A Quinlivan is a 26 y.o. [id number] African American female who presents for a postpartum visit. She is 6 weeks postpartum following a spontaneous vaginal delivery of twins at 36.6 gestational weeks. Anesthesia: epidural. I have fully reviewed the prenatal and intrapartum course. Postpartum course has been uncomplicated. Babies are doing well and feeding by breast and expressed milk. Bleeding no bleeding. Bowel function is normal. Bladder function is normal. Patient is not sexually active. Contraception method is abstinence and vasectomy. Postpartum depression screening: negative. Denies suicidal ideations and homicidal ideation. Last pap 11/06/2015 and was normal.  The following portions of the patient's history were reviewed and updated as appropriate: allergies, current medications, past medical history, past surgical history and problem list.  Review of Systems Pertinent items are noted in HPI.   Vitals:   05/22/16 1613  BP: 122/70  Pulse: 65  Weight: 225 lb (102.1 kg)  Height: 5\' 10"  (1.778 m)   No LMP recorded.  Objective:   General:  alert, cooperative and no distress   Breasts:  deferred, no complaints  Lungs: clear to auscultation bilaterally  Heart:  regular rate and rhythm  Abdomen: soft, nontender   Vulva: normal  Vagina: normal vagina  Cervix:  closed  Corpus: Well-involuted  Adnexa:  Non-palpable        Assessment:   Postpartum exam 6 wks s/p vaginal delivery twins Breastfeeding Depression screening Contraception counseling   Plan:  Contraception reviewed: abstinence and vasectomy Breastfeeding tips, supplements, and techniques discussed Follow up in: 5 month for annual exam or earlier if needed   Gunnar BullaJenkins Michelle Lawhorn, CNM

## 2016-06-16 ENCOUNTER — Encounter: Payer: Self-pay | Admitting: Certified Nurse Midwife

## 2016-06-16 ENCOUNTER — Encounter: Payer: BLUE CROSS/BLUE SHIELD | Admitting: Certified Nurse Midwife

## 2016-06-16 ENCOUNTER — Ambulatory Visit (INDEPENDENT_AMBULATORY_CARE_PROVIDER_SITE_OTHER): Payer: BLUE CROSS/BLUE SHIELD | Admitting: Certified Nurse Midwife

## 2016-06-16 VITALS — BP 111/69 | HR 73 | Temp 98.7°F | Wt 225.1 lb

## 2016-06-16 DIAGNOSIS — F419 Anxiety disorder, unspecified: Secondary | ICD-10-CM

## 2016-06-16 DIAGNOSIS — N61 Mastitis without abscess: Secondary | ICD-10-CM

## 2016-06-16 DIAGNOSIS — B370 Candidal stomatitis: Secondary | ICD-10-CM | POA: Diagnosis not present

## 2016-06-16 MED ORDER — CLOTRIMAZOLE 1 % EX CREA: 1.0000 "application " | TOPICAL_CREAM | Freq: Two times a day (BID) | CUTANEOUS | 0 refills | Status: DC

## 2016-06-16 MED ORDER — DICLOXACILLIN SODIUM 500 MG PO CAPS: 500.0000 mg | ORAL_CAPSULE | Freq: Four times a day (QID) | ORAL | 0 refills | Status: AC

## 2016-06-16 MED ORDER — HYDROXYZINE HCL 25 MG PO TABS: 25.0000 mg | ORAL_TABLET | Freq: Three times a day (TID) | ORAL | 0 refills | Status: DC | PRN

## 2016-06-16 NOTE — Patient Instructions (Signed)
Mastitis  Mastitis is inflammation of the breast tissue. It occurs most often in women who are breastfeeding, but it can also affect other women, and even sometimes men.  What are the causes?  Mastitis is usually caused by a bacterial infection. Bacteria enter the breast tissue through cuts or openings in the skin. Typically, this occurs with breastfeeding because of cracked or irritated skin. Sometimes, it can occur even when there is no opening in the skin. It can be associated with plugged milk (lactiferous) ducts. Nipple piercing can also lead to mastitis. Also, some forms of breast cancer can cause mastitis.  What are the signs or symptoms?  · Swelling, redness, tenderness, and pain in an area of the breast.  · Swelling of the glands under the arm on the same side.  · Fever.  If an infection is allowed to progress, a collection of pus (abscess) may develop.  How is this diagnosed?  Your health care provider can usually diagnose mastitis based on your symptoms and a physical exam. Tests may be done to help confirm the diagnosis. These may include:  · Removal of pus from the breast by applying pressure to the area. This pus can be examined in the lab to determine which bacteria are present. If an abscess has developed, the fluid in the abscess can be removed with a needle. This can also be used to confirm the diagnosis and determine the bacteria present. In most cases, pus will not be present.  · Blood tests to determine if your body is fighting a bacterial infection.  · Mammogram or ultrasound tests to rule out other problems or diseases.    How is this treated?  Antibiotic medicine is used to treat a bacterial infection. Your health care provider will determine which bacteria are most likely causing the infection and will select an appropriate antibiotic. This is sometimes changed based on the results of tests performed to identify the bacteria, or if there is no response to the antibiotic selected. Antibiotics  are usually given by mouth. You may also be given medicine for pain.  Mastitis that occurs with breastfeeding will sometimes go away on its own, so your health care provider may choose to wait 24 hours after first seeing you to decide whether a prescription medicine is needed.  Follow these instructions at home:  · Only take over-the-counter or prescription medicines for pain, fever, or discomfort as directed by your health care provider.  · If your health care provider prescribed an antibiotic, take the medicine as directed. Make sure you finish it even if you start to feel better.  · Do not wear a tight or underwire bra. Wear a soft, supportive bra.  · Increase your fluid intake, especially if you have a fever.  · Women who are breastfeeding should follow these instructions:  ? Continue to empty the breast. Your health care provider can tell you whether this milk is safe for your infant or needs to be thrown out. You may be told to stop nursing until your health care provider thinks it is safe for your baby. Use a breast pump if you are advised to stop nursing.  ? Keep your nipples clean and dry.  ? Empty the first breast completely before going to the other breast. If your baby is not emptying your breasts completely for some reason, use a breast pump to empty your breasts.  ? If you go back to work, pump your breasts while at work to stay   in time with your nursing schedule.  ? Avoid allowing your breasts to become overly filled with milk (engorged).  Contact a health care provider if:  · You have pus-like discharge from the breast.  · Your symptoms do not improve with the treatment prescribed by your health care provider within 2 days.  Get help right away if:  · Your pain and swelling are getting worse.  · You have pain that is not controlled with medicine.  · You have a red line extending from the breast toward your armpit.  · You have a fever or persistent symptoms for more than 2-3 days.  · You have a fever  and your symptoms suddenly get worse.  This information is not intended to replace advice given to you by your health care provider. Make sure you discuss any questions you have with your health care provider.  Document Released: 03/30/2005 Document Revised: 09/05/2015 Document Reviewed: 10/28/2012  Elsevier Interactive Patient Education © 2017 Else[employer] Inc.

## 2016-06-16 NOTE — Progress Notes (Signed)
GYN ENCOUNTER NOTE  Subjective:       Julie Powers is a 26 y.o. [phone] female is here for gynecologic evaluation of the following issues: right breast pain and redness.   Currently breastfeeding twins, born 04/10/2016. Babies diagnosed with thrush last Friday (06/12/2016). Treatment oral nystatin PO QID x seven (7) days. Not given any treatment for nipples.   On Sunday, she reports start of chills, sweating, and body aches. Slight relief of symptoms with tylenol and motrin.   Julie Powers reports a few episodes of increased anger and anxiety over "little things". 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 Endorses little relief with meditation and removal of situational stressors. Denies SI/HI.   Denies difficulty breathing or respiratory distress, chest pain, abdominal pain, vaginal bleeding, and leg pain or swelling.    Gynecologic History  No LMP recorded. Pt is breastfeeding; LAM.   Contraception: abstinence and vasectomy   Last Pap: 10/2015. Results were: normal  Obstetric History OB History  Gravida Para Term Preterm AB Living  3 3 2 1   4   SAB TAB Ectopic Multiple Live Births        1 4    # Outcome Date GA Lbr Len/2nd Weight Sex Delivery Anes PTL Lv  3A Preterm 04/10/16 8668w6d / 00:09 5 lb 15.9 oz (2.72 kg) M Vag-Spont EPI  LIV  3B Preterm 04/10/16 4868w6d / 00:29 6 lb 5.4 oz (2.875 kg) F Vag-Breech EPI  LIV  2 Term 2015 215w2d   M Vag-Spont  N LIV  1 Term 2012 6570w4d   F Vag-Spont  N LIV      Past Medical History:  Diagnosis Date  . Anxiety     History reviewed. No pertinent surgical history.  Current Outpatient Prescriptions on File Prior to Visit  Medication Sig Dispense Refill  . Prenatal Vit-Fe Fumarate-FA (PRENATAL MULTIVITAMIN) TABS tablet Take 1 tablet by mouth daily at 12 noon.     No current facility-administered medications on file prior to visit.     No Known Allergies  Social History    Social History  . Marital status: Married    Spouse name: N/A  . Number of children: N/A  . Years of education: N/A   Occupational History  . Not on file.   Social History Main Topics  . Smoking status: Never Smoker  . Smokeless tobacco: Never Used  . Alcohol use Yes  . Drug use: No  . Sexual activity: Not Currently   Other Topics Concern  . Not on file   Social History Narrative  . No narrative on file    Family History  Problem Relation Age of Onset  . Diabetes Mother   . Cancer Maternal Grandfather     lung    The following portions of the patient's history were reviewed and updated as appropriate: allergies, current medications, past family history, past medical history, past social history, past surgical history and problem list.  Review of Systems  Review of Systems - Negative except as noted above History obtained from the patient  Objective:   BP 111/69   Pulse 73   Temp 98.7 F (37.1 C) (Oral)   Wt 225 lb 1.6 oz (102.1 kg)   Breastfeeding? Yes   BMI 32.30 kg/m   Alert and oriented x 4   Breasts:   Right breast tender to touch with red wedge at 2 o'clock.   Bilateral nipples red  and cracked with white residue present.   Left breast non-tenderness, no redness, lesions, or masses noted.   Assessment:   1. Mastitis, right, acute  2. Anxiety  3. Thrush to nipples  Plan:   Rx Dicloxicillin, Clotrimazole, see orders.   Rx Vistaril 25-50 mg PRN anxiety. Encouraged talk therapy, relaxation techniques, and use of positive affirmations.   Encourage home treatment measures like frequently emptying breast, increased water intake, and Motrin 800 mg q 8 hours.   RTC x 3 days if symptoms worsen or fail to improve  RTC as needed   Gunnar Bulla, CNM

## 2016-06-19 ENCOUNTER — Encounter: Payer: Self-pay | Admitting: Certified Nurse Midwife

## 2016-10-21 ENCOUNTER — Other Ambulatory Visit: Payer: Self-pay | Admitting: Obstetrics and Gynecology

## 2016-10-21 ENCOUNTER — Ambulatory Visit (INDEPENDENT_AMBULATORY_CARE_PROVIDER_SITE_OTHER): Payer: BLUE CROSS/BLUE SHIELD | Admitting: Obstetrics and Gynecology

## 2016-10-21 ENCOUNTER — Encounter: Payer: Self-pay | Admitting: Obstetrics and Gynecology

## 2016-10-21 VITALS — BP 113/77 | HR 73 | Ht 70.0 in | Wt 221.4 lb

## 2016-10-21 DIAGNOSIS — Z01419 Encounter for gynecological examination (general) (routine) without abnormal findings: Secondary | ICD-10-CM

## 2016-10-21 NOTE — Progress Notes (Signed)
   Subjective:     Julie Powers is a married black 26 y.o. female and is here for a comprehensive physical exam. The patient reports no problems. FT homemaker. Using condoms until spouse has vasectomy. Denies menses since breastfeeding.  Social History   Social History  . Marital status: Married    Spouse name: N/A  . Number of children: N/A  . Years of education: N/A   Occupational History  . Not on file.   Social History Main Topics  . Smoking status: Never Smoker  . Smokeless tobacco: Never Used  . Alcohol use Yes  . Drug use: No  . Sexual activity: Yes    Birth control/ protection: Condom   Other Topics Concern  . Not on file   Social History Narrative  . No narrative on file   Health Maintenance  Topic Date Due  . INFLUENZA VACCINE  11/11/2016  . PAP SMEAR  11/06/2018  . TETANUS/TDAP  02/11/2026  . HIV Screening  Completed    The following portions of the patient's history were reviewed and updated as appropriate: allergies, current medications, past family history, past medical history, past social history, past surgical history and problem list.  Review of Systems A comprehensive review of systems was negative.   Objective:    General appearance: alert, cooperative and appears stated age Neck: no adenopathy, no carotid bruit, no JVD, supple, symmetrical, trachea midline and thyroid not enlarged, symmetric, no tenderness/mass/nodules Lungs: clear to auscultation bilaterally Breasts: normal appearance, no masses or tenderness Heart: regular rate and rhythm, S1, S2 normal, no murmur, click, rub or gallop Abdomen: soft, non-tender; bowel sounds normal; no masses,  no organomegaly Pelvic: cervix normal in appearance, external genitalia normal, no adnexal masses or tenderness, no cervical motion tenderness, rectovaginal septum normal, uterus normal size, shape, and consistency and vagina normal without discharge    Assessment:    Healthy female exam.  Lactational amenorrhea overweight     Plan:  Labs and pap obtained, will follow up accordingly. RTC 1 year or as needed.  Julie Powers, CNM   See After Visit Summary for Counseling Recommendations

## 2016-10-22 LAB — HEMOGLOBIN A1C
Est. average glucose Bld gHb Est-mCnc: 103 mg/dL
Hgb A1c MFr Bld: 5.2 % (ref 4.8–5.6)

## 2016-10-22 LAB — COMPREHENSIVE METABOLIC PANEL
ALBUMIN: 4.7 g/dL (ref 3.5–5.5)
ALT: 16 IU/L (ref 0–32)
AST: 19 IU/L (ref 0–40)
Albumin/Globulin Ratio: 1.7 (ref 1.2–2.2)
Alkaline Phosphatase: 91 IU/L (ref 39–117)
BILIRUBIN TOTAL: 0.4 mg/dL (ref 0.0–1.2)
BUN / CREAT RATIO: 13 (ref 9–23)
BUN: 12 mg/dL (ref 6–20)
CHLORIDE: 102 mmol/L (ref 96–106)
CO2: 23 mmol/L (ref 20–29)
Calcium: 10.4 mg/dL — ABNORMAL HIGH (ref 8.7–10.2)
Creatinine, Ser: 0.91 mg/dL (ref 0.57–1.00)
GFR calc Af Amer: 100 mL/min/{1.73_m2} (ref >59)
GFR calc non Af Amer: 87 mL/min/{1.73_m2} (ref >59)
GLOBULIN, TOTAL: 2.8 g/dL (ref 1.5–4.5)
Glucose: 82 mg/dL (ref 65–99)
POTASSIUM: 4.7 mmol/L (ref 3.5–5.2)
SODIUM: 139 mmol/L (ref 134–144)
Total Protein: 7.5 g/dL (ref 6.0–8.5)

## 2016-10-22 LAB — CBC
HEMATOCRIT: 42.5 % (ref 34.0–46.6)
Hemoglobin: 13.5 g/dL (ref 11.1–15.9)
MCH: 25.9 pg — ABNORMAL LOW (ref 26.6–33.0)
MCHC: 31.8 g/dL (ref 31.5–35.7)
MCV: 82 fL (ref 79–97)
PLATELETS: 305 10*3/uL (ref 150–379)
RBC: 5.21 x10E6/uL (ref 3.77–5.28)
RDW: 13.2 % (ref 12.3–15.4)
WBC: 5.4 10*3/uL (ref 3.4–10.8)

## 2016-10-22 LAB — FERRITIN: Ferritin: 105 ng/mL (ref 15–150)

## 2016-10-22 LAB — VITAMIN D 25 HYDROXY (VIT D DEFICIENCY, FRACTURES): Vit D, 25-Hydroxy: 32 ng/mL (ref 30.0–100.0)

## 2016-10-23 LAB — CYTOLOGY - PAP

## 2017-07-07 ENCOUNTER — Ambulatory Visit (INDEPENDENT_AMBULATORY_CARE_PROVIDER_SITE_OTHER): Payer: Managed Care, Other (non HMO) | Admitting: Obstetrics and Gynecology

## 2017-07-07 ENCOUNTER — Encounter: Payer: Self-pay | Admitting: Obstetrics and Gynecology

## 2017-07-07 VITALS — BP 113/72 | HR 60 | Ht 70.0 in | Wt 222.7 lb

## 2017-07-07 DIAGNOSIS — L80 Vitiligo: Secondary | ICD-10-CM | POA: Diagnosis not present

## 2017-07-07 NOTE — Progress Notes (Signed)
Subjective:     Patient ID: Julie Powers, female   DOB: Apr 02, 1991, 27 y.o.   MRN: [id number]  HPI Noted itchy in vuvla area, and now skin is Arts development officerchanging color-lighter.  Not used any medications. For about 1 month.   Review of Systems negtaive    Objective:   Physical Exam A&Ox4 Well groomed female in no distress Blood pressure 113/72, pulse 60, height 5\' 10"  (1.778 m), weight 222 lb 11.2 oz (101 kg), last menstrual period 06/23/2017, currently breastfeeding. Pelvic exam: VULVA: normal appearing vulva with no masses, tenderness or lesions, vulvar hypopigmentation at lower perineum and on left vulva, VAGINA: normal appearing vagina with normal color and discharge, no lesions, CERVIX: normal appearing cervix without discharge or lesions.     Assessment:     vitaligo of vulva    Plan:     Offered biopsy for positive diagnosis, patient declined. Counseled on findings and no real treatment except clobex cream for itching. RTC as needed.  Melody GibsonShambley, CNM

## 2017-10-26 ENCOUNTER — Ambulatory Visit (INDEPENDENT_AMBULATORY_CARE_PROVIDER_SITE_OTHER): Payer: Managed Care, Other (non HMO) | Admitting: Obstetrics and Gynecology

## 2017-10-26 ENCOUNTER — Encounter: Payer: Self-pay | Admitting: Obstetrics and Gynecology

## 2017-10-26 VITALS — BP 111/72 | HR 68 | Ht 70.0 in | Wt 227.0 lb

## 2017-10-26 DIAGNOSIS — F3281 Premenstrual dysphoric disorder: Secondary | ICD-10-CM

## 2017-10-26 DIAGNOSIS — E669 Obesity, unspecified: Secondary | ICD-10-CM | POA: Diagnosis not present

## 2017-10-26 DIAGNOSIS — Z01419 Encounter for gynecological examination (general) (routine) without abnormal findings: Secondary | ICD-10-CM

## 2017-10-26 MED ORDER — FLUOXETINE HCL 10 MG PO CAPS: 10.0000 mg | ORAL_CAPSULE | Freq: Every day | ORAL | 3 refills | Status: DC

## 2017-10-26 NOTE — Patient Instructions (Addendum)
Preventive Care 18-39 Years, Female Preventive care refers to lifestyle choices and visits with your health care provider that can promote health and wellness. What does preventive care include?  A yearly physical exam. This is also called an annual well check.  Dental exams once or twice a year.  Routine eye exams. Ask your health care provider how often you should have your eyes checked.  Personal lifestyle choices, including: ? Daily care of your teeth and gums. ? Regular physical activity. ? Eating a healthy diet. ? Avoiding tobacco and drug use. ? Limiting alcohol use. ? Practicing safe sex. ? Taking vitamin and mineral supplements as recommended by your health care provider. What happens during an annual well check? The services and screenings done by your health care provider during your annual well check will depend on your age, overall health, lifestyle risk factors, and family history of disease. Counseling Your health care provider may ask you questions about your:  Alcohol use.  Tobacco use.  Drug use.  Emotional well-being.  Home and relationship well-being.  Sexual activity.  Eating habits.  Work and work Statistician.  Method of birth control.  Menstrual cycle.  Pregnancy history.  Screening You may have the following tests or measurements:  Height, weight, and BMI.  Diabetes screening. This is done by checking your blood sugar (glucose) after you have not eaten for a while (fasting).  Blood pressure.  Lipid and cholesterol levels. These may be checked every 5 years starting at age 38.  Skin check.  Hepatitis C blood test.  Hepatitis B blood test.  Sexually transmitted disease (STD) testing.  BRCA-related cancer screening. This may be done if you have a family history of breast, ovarian, tubal, or peritoneal cancers.  Pelvic exam and Pap test. This may be done every 3 years starting at age 38. Starting at age 30, this may be done  every 5 years if you have a Pap test in combination with an HPV test.  Discuss your test results, treatment options, and if necessary, the need for more tests with your health care provider. Vaccines Your health care provider may recommend certain vaccines, such as:  Influenza vaccine. This is recommended every year.  Tetanus, diphtheria, and acellular pertussis (Tdap, Td) vaccine. You may need a Td booster every 10 years.  Varicella vaccine. You may need this if you have not been vaccinated.  HPV vaccine. If you are 39 or younger, you may need three doses over 6 months.  Measles, mumps, and rubella (MMR) vaccine. You may need at least one dose of MMR. You may also need a second dose.  Pneumococcal 13-valent conjugate (PCV13) vaccine. You may need this if you have certain conditions and were not previously vaccinated.  Pneumococcal polysaccharide (PPSV23) vaccine. You may need one or two doses if you smoke cigarettes or if you have certain conditions.  Meningococcal vaccine. One dose is recommended if you are age 68-21 years and a first-year college student living in a residence hall, or if you have one of several medical conditions. You may also need additional booster doses.  Hepatitis A vaccine. You may need this if you have certain conditions or if you travel or work in places where you may be exposed to hepatitis A.  Hepatitis B vaccine. You may need this if you have certain conditions or if you travel or work in places where you may be exposed to hepatitis B.  Haemophilus influenzae type b (Hib) vaccine. You may need this  if you have certain risk factors.  Talk to your health care provider about which screenings and vaccines you need and how often you need them. This information is not intended to replace advice given to you by your health care provider. Make sure you discuss any questions you have with your health care provider. Document Released: 05/26/2001 Document Revised:  12/18/2015 Document Reviewed: 01/29/2015 Elsevier Interactive Patient Education  2018 [employer].    Premenstrual Syndrome Premenstrual syndrome (PMS) is a group of physical, emotional, and behavioral symptoms that affect women of childbearing age. PMS starts 1-2 weeks before the start of a woman's period and goes away a few days after the period starts. It often recurs in a predictable pattern. PMS can range from mild to severe. When it is severe, it is called premenstrual dysphoric disorder (PMDD). PMS can interfere in many ways with normal daily activities. What are the causes? The cause of this condition is not known, but it seems to be related to hormone changes that happen before menstruation. What are the signs or symptoms? Symptoms of this condition often happen every month. They go away completely after your period starts. Physical symptoms include:  Bloating.  Breast pain.  Headaches.  Extreme fatigue.  Backaches.  Swelling of the hands and feet.  Weight gain.  Hot flashes.  Emotional and behavioral symptoms include:  Mood swings.  Depression.  Angry outbursts.  Irritability.  Anxiety.  Crying spells.  Food cravings or appetite changes.  Changes in sexual desire.  Confusion.  Aggression.  Social withdrawal.  Poor concentration.  How is this diagnosed? This condition is diagnosed if symptoms of PMS:  Are present in the 5 days before your period starts.  End within 4 days after your period starts.  Happen at least 3 months in a row.  Interfere with some of your normal activities.  Other conditions that can cause some of these symptoms must be ruled out before PMS can be diagnosed. How is this treated? This condition may be treated by:  Maintaining a healthy lifestyle. This includes eating a balanced diet and exercising regularly.  Taking medicines. Medicines can help relieve symptoms such as cramps, aches, pains, headaches, and breast  tenderness. Depending on the severity of the condition, your health care provider may recommend: ? Over-the-counter pain medicines. ? Prescription medicines for PMDD.  Follow these instructions at home: Eating and drinking   Eat a well-balanced diet.  Avoid caffeine and alcohol.  Limit the amount of salt and salty foods you eat. This will help lessen bloating.  Drink enough fluid to keep your urine clear or pale yellow.  Take a multivitamin if told to by your health care provider. Lifestyle  Do not use any tobacco products, such as cigarettes, chewing tobacco, and e-cigarettes. If you need help quitting, ask your health care provider.  Exercise regularly as suggested by your health care provider.  Get enough sleep.  Practice relaxation techniques.  Limit stress. Other Instructions  For 2-3 months, write down your symptoms, their severity, and how long they last. This will help your health care provider choose the best treatment for you.  Take over-the-counter and prescription medicines only as told by your health care provider.  If you are using oral contraceptive pills, use them as told by your health care provider. This information is not intended to replace advice given to you by your health care provider. Make sure you discuss any questions you have with your health care provider. Document Released:  03/27/2000 Document Revised: 05/01/2015 Document Reviewed: 12/28/2014 Elsevier Interactive Patient Education  [employer].

## 2017-10-26 NOTE — Progress Notes (Signed)
  Subjective:     Julie Powers is a married black 27 y.o. female and is here for a comprehensive physical exam.  Is sexually active with female spouse, usually condoms occasionally. Working FTThe patient reports worsening mood swings around menses desires medication.  Social History   Socioeconomic History  . Marital status: Married    Spouse name: Not on file  . Number of children: Not on file  . Years of education: Not on file  . Highest education level: Not on file  Occupational History  . Not on file  Social Needs  . Financial resource strain: Not on file  . Food insecurity:    Worry: Not on file    Inability: Not on file  . Transportation needs:    Medical: Not on file    Non-medical: Not on file  Tobacco Use  . Smoking status: Never Smoker  . Smokeless tobacco: Never Used  Substance and Sexual Activity  . Alcohol use: Yes  . Drug use: No  . Sexual activity: Yes    Birth control/protection: Condom  Lifestyle  . Physical activity:    Days per week: Not on file    Minutes per session: Not on file  . Stress: Not on file  Relationships  . Social connections:    Talks on phone: Not on file    Gets together: Not on file    Attends religious service: Not on file    Active member of club or organization: Not on file    Attends meetings of clubs or organizations: Not on file    Relationship status: Not on file  . Intimate partner violence:    Fear of current or ex partner: Not on file    Emotionally abused: Not on file    Physically abused: Not on file    Forced sexual activity: Not on file  Other Topics Concern  . Not on file  Social History Narrative  . Not on file   Health Maintenance  Topic Date Due  . INFLUENZA VACCINE  11/11/2017  . PAP SMEAR  10/22/2019  . TETANUS/TDAP  02/11/2026  . HIV Screening  Completed    The following portions of the patient's history were reviewed and updated as appropriate: allergies, current medications, past family history,  past medical history, past social history, past surgical history and problem list.  Review of Systems Pertinent items noted in HPI and remainder of comprehensive ROS otherwise negative.   Objective:    General appearance: alert, cooperative and appears stated age Neck: no adenopathy, no carotid bruit, no JVD, supple, symmetrical, trachea midline and thyroid not enlarged, symmetric, no tenderness/mass/nodules Lungs: clear to auscultation bilaterally Breasts: normal appearance, no masses or tenderness Heart: regular rate and rhythm, S1, S2 normal, no murmur, click, rub or gallop Abdomen: soft, non-tender; bowel sounds normal; no masses,  no organomegaly Pelvic: cervix normal in appearance, external genitalia normal, no adnexal masses or tenderness, no cervical motion tenderness, rectovaginal septum normal, uterus normal size, shape, and consistency and vagina normal without discharge    Assessment:    Healthy female exam.  obesity   PMDD  Plan:  Trial of prozac for PMDD-rx for 10mg  tablets sent in.  RTC 1 year or as needed.  Tyr Franca,CNM   See After Visit Summary for Counseling Recommendations

## 2018-02-07 ENCOUNTER — Encounter: Payer: Self-pay | Admitting: Primary Care

## 2018-02-07 ENCOUNTER — Ambulatory Visit (INDEPENDENT_AMBULATORY_CARE_PROVIDER_SITE_OTHER): Payer: Managed Care, Other (non HMO) | Admitting: Primary Care

## 2018-02-07 VITALS — BP 104/64 | HR 82 | Temp 98.1°F | Ht 70.0 in | Wt 230.8 lb

## 2018-02-07 DIAGNOSIS — Z0000 Encounter for general adult medical examination without abnormal findings: Secondary | ICD-10-CM | POA: Diagnosis not present

## 2018-02-07 LAB — LIPID PANEL
Cholesterol: 151 mg/dL (ref 0–200)
HDL: 64.5 mg/dL (ref >39.00)
LDL Cholesterol: 69 mg/dL (ref 0–99)
NONHDL: 86.59
Total CHOL/HDL Ratio: 2
Triglycerides: 90 mg/dL (ref 0.0–149.0)
VLDL: 18 mg/dL (ref 0.0–40.0)

## 2018-02-07 NOTE — Patient Instructions (Signed)
Stop by the lab prior to leaving today. I will notify you of your results once received.   Start exercising. You should be getting 150 minutes of moderate intensity exercise weekly.  It's important to improve your diet by reducing consumption of fast food, fried food, processed snack foods, sugary drinks. Increase consumption of fresh vegetables and fruits, whole grains, water.  Ensure you are drinking 64 ounces of water daily.  It was a pleasure to meet you today! Please don't hesitate to call or message me with any questions. Welcome to [facility]!   Preventive Care 18-39 Years, Female Preventive care refers to lifestyle choices and visits with your health care provider that can promote health and wellness. What does preventive care include?  A yearly physical exam. This is also called an annual well check.  Dental exams once or twice a year.  Routine eye exams. Ask your health care provider how often you should have your eyes checked.  Personal lifestyle choices, including: ? Daily care of your teeth and gums. ? Regular physical activity. ? Eating a healthy diet. ? Avoiding tobacco and drug use. ? Limiting alcohol use. ? Practicing safe sex. ? Taking vitamin and mineral supplements as recommended by your health care provider. What happens during an annual well check? The services and screenings done by your health care provider during your annual well check will depend on your age, overall health, lifestyle risk factors, and family history of disease. Counseling Your health care provider may ask you questions about your:  Alcohol use.  Tobacco use.  Drug use.  Emotional well-being.  Home and relationship well-being.  Sexual activity.  Eating habits.  Work and work Statistician.  Method of birth control.  Menstrual cycle.  Pregnancy history.  Screening You may have the following tests or measurements:  Height, weight, and BMI.  Diabetes screening. This is  done by checking your blood sugar (glucose) after you have not eaten for a while (fasting).  Blood pressure.  Lipid and cholesterol levels. These may be checked every 5 years starting at age 100.  Skin check.  Hepatitis C blood test.  Hepatitis B blood test.  Sexually transmitted disease (STD) testing.  BRCA-related cancer screening. This may be done if you have a family history of breast, ovarian, tubal, or peritoneal cancers.  Pelvic exam and Pap test. This may be done every 3 years starting at age 28. Starting at age 83, this may be done every 5 years if you have a Pap test in combination with an HPV test.  Discuss your test results, treatment options, and if necessary, the need for more tests with your health care provider. Vaccines Your health care provider may recommend certain vaccines, such as:  Influenza vaccine. This is recommended every year.  Tetanus, diphtheria, and acellular pertussis (Tdap, Td) vaccine. You may need a Td booster every 10 years.  Varicella vaccine. You may need this if you have not been vaccinated.  HPV vaccine. If you are 33 or younger, you may need three doses over 6 months.  Measles, mumps, and rubella (MMR) vaccine. You may need at least one dose of MMR. You may also need a second dose.  Pneumococcal 13-valent conjugate (PCV13) vaccine. You may need this if you have certain conditions and were not previously vaccinated.  Pneumococcal polysaccharide (PPSV23) vaccine. You may need one or two doses if you smoke cigarettes or if you have certain conditions.  Meningococcal vaccine. One dose is recommended if you are age 50-21  years and a Market researcher living in a residence hall, or if you have one of several medical conditions. You may also need additional booster doses.  Hepatitis A vaccine. You may need this if you have certain conditions or if you travel or work in places where you may be exposed to hepatitis A.  Hepatitis B  vaccine. You may need this if you have certain conditions or if you travel or work in places where you may be exposed to hepatitis B.  Haemophilus influenzae type b (Hib) vaccine. You may need this if you have certain risk factors.  Talk to your health care provider about which screenings and vaccines you need and how often you need them. This information is not intended to replace advice given to you by your health care provider. Make sure you discuss any questions you have with your health care provider. Document Released: 05/26/2001 Document Revised: 12/18/2015 Document Reviewed: 01/29/2015 Elsevier Interactive Patient Education  [employer].

## 2018-02-07 NOTE — Addendum Note (Signed)
Addended by: [name] on: 02/07/2018 10:21 AM   Modules accepted: Level of Service

## 2018-02-07 NOTE — Assessment & Plan Note (Signed)
Immunizations UTD, will get influenza vaccination at work. Pap smear UTD. Discussed the importance of a healthy diet and regular exercise in order for weight loss, and to reduce the risk of any potential medical problems. Exam unremarkable. Labs pending. Labs also reviewed from Care Everywhere. Follow up in 1 year for CPE.

## 2018-02-07 NOTE — Progress Notes (Signed)
Subjective:    Patient ID: Julie Powers, female    DOB: 1991/03/16, 27 y.o.   MRN: [id number]  HPI  [name] is a 27 year old female who presents today to establish care and for complete physical. Will obtain old records.  Immunizations: -Tetanus: Completed in 2017 -Influenza: Due, will get at work :  Diet: She endorses a poor diet Breakfast: Cereal  Lunch: Restaurants Dinner: Chicken, rice, vegetable, beans Snacks: Crackers Desserts: Daily  Beverages: Water, soda   Exercise: She is not exercising  Eye exam: No recent exam Dental exam: Completes annually  Pap Smear: Completed in 2018   Review of Systems  Constitutional: Negative for unexpected weight change.  HENT: Negative for rhinorrhea.   Respiratory: Negative for cough and shortness of breath.   Cardiovascular: Negative for chest pain.  Gastrointestinal: Negative for constipation and diarrhea.  Genitourinary: Negative for difficulty urinating and menstrual problem.  Musculoskeletal: Negative for arthralgias and myalgias.  Skin: Negative for rash.  Allergic/Immunologic: Negative for environmental allergies.  Neurological: Negative for numbness and headaches.  Psychiatric/Behavioral:       Intermittent anxiety during menstrual cycles, once on Prozac and only took one dose.        Past Medical History:  Diagnosis Date  . Anxiety      Social History   Socioeconomic History  . Marital status: Married    Spouse name: Not on file  . Number of children: Not on file  . Years of education: Not on file  . Highest education level: Not on file  Occupational History  . Not on file  Social Needs  . Financial resource strain: Not on file  . Food insecurity:    Worry: Not on file    Inability: Not on file  . Transportation needs:    Medical: Not on file    Non-medical: Not on file  Tobacco Use  . Smoking status: Never Smoker  . Smokeless tobacco: Never Used  Substance and Sexual Activity  . Alcohol use:  Yes  . Drug use: No  . Sexual activity: Yes    Birth control/protection: Condom  Lifestyle  . Physical activity:    Days per week: Not on file    Minutes per session: Not on file  . Stress: Not on file  Relationships  . Social connections:    Talks on phone: Not on file    Gets together: Not on file    Attends religious service: Not on file    Active member of club or organization: Not on file    Attends meetings of clubs or organizations: Not on file    Relationship status: Not on file  . Intimate partner violence:    Fear of current or ex partner: Not on file    Emotionally abused: Not on file    Physically abused: Not on file    Forced sexual activity: Not on file  Other Topics Concern  . Not on file  Social History Narrative   Married.   4 children.   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     History reviewed. No pertinent surgical history.  Family History  Problem Relation Age of Onset  . Diabetes Mother   . Hypertension Mother   . Cancer Maternal Grandfather        lung    No Known Allergies  Current Outpatient Medications on File Prior to Visit  Medication Sig Dispense Refill  . GARLIC PO Take by mouth.    Marland Kitchen  Lactobacillus (PROBIOTIC ACIDOPHILUS PO) Take by mouth.    . Omega-3 Fatty Acids (FISH OIL) 1000 MG CAPS Take by mouth.    . Multiple Vitamin (MULTI-VITAMINS) TABS Take by mouth.     No current facility-administered medications on file prior to visit.     BP 104/64   Pulse 82   Temp 98.1 F (36.7 C) (Oral)   Ht 5\' 10"  (1.778 m)   Wt 230 lb 12 oz (104.7 kg)   LMP 01/19/2018   SpO2 98%   BMI 33.11 kg/m    Objective:   Physical Exam  Constitutional: She is oriented to person, place, and time. She appears well-nourished.  HENT:  Mouth/Throat: No oropharyngeal exudate.  Eyes: Pupils are equal, round, and reactive to light. EOM are normal.  Neck: Neck supple. No thyromegaly present.  Cardiovascular: Normal rate and regular rhythm.  Respiratory: Effort  normal and breath sounds normal.  GI: Soft. Bowel sounds are normal. There is no tenderness.  Musculoskeletal: Normal range of motion.  Neurological: She is alert and oriented to person, place, and time.  Skin: Skin is warm and dry.  Psychiatric: She has a normal mood and affect.           Assessment & Plan:

## 2018-06-03 ENCOUNTER — Ambulatory Visit: Payer: Managed Care, Other (non HMO) | Admitting: Obstetrics and Gynecology

## 2018-07-15 ENCOUNTER — Ambulatory Visit (INDEPENDENT_AMBULATORY_CARE_PROVIDER_SITE_OTHER): Payer: Managed Care, Other (non HMO) | Admitting: Primary Care

## 2018-07-15 ENCOUNTER — Encounter: Payer: Self-pay | Admitting: Primary Care

## 2018-07-15 ENCOUNTER — Other Ambulatory Visit: Payer: Self-pay

## 2018-07-15 DIAGNOSIS — F411 Generalized anxiety disorder: Secondary | ICD-10-CM | POA: Diagnosis not present

## 2018-07-15 MED ORDER — HYDROXYZINE HCL 10 MG PO TABS: 10.0000 mg | ORAL_TABLET | Freq: Two times a day (BID) | ORAL | 0 refills | Status: DC | PRN

## 2018-07-15 NOTE — Patient Instructions (Signed)
You may take the hydroxyzine 10 mg tablets up to two times daily as needed for anxiety. You may increase up to 20 mg if needed.  You will be contacted regarding your referral to therapy.  Please let us know if you have not been contacted within one week.   It was nice to see you! [name], NP-C

## 2018-07-15 NOTE — Progress Notes (Signed)
Subjective:    Patient ID: Julie Powers, female    DOB: [birth year], 28 y.o.   MRN: [id number]  HPI  Virtual Visit via Video Note  I connected with Julie Powers on 07/15/18 at  9:00 AM EDT by a video enabled telemedicine application and verified that I am speaking with the correct person using two identifiers.   I discussed the limitations of evaluation and management by telemedicine and the availability of in person appointments. The patient expressed understanding and agreed to proceed. She is at work, I am in the office.  History of Present Illness:  Julie Powers is a 28 year old female with a history of anxiety who presents today video with a chief complaint of anxiety. Symptoms include feeling overwhelmed with work, Covid-19, her children, her mother. She feels everything "weighing down" on her, symptoms began several months ago. She was once managed on hydroxyzine in the past for anxiety just after her last pregnancy in 2018. She took this as needed for acute anxiety infrequently overall, took her last pill last night and is requesting a refill. The medication does cause drowsiness, she was on 25 mg tablets. She would like to start meeting with a therapist, would like a referral. GAD 7 score of 16 today.     Observations/Objective:  Speaking in complete sentences. Alert and oriented. No distress. Mood appropriate, good eye contact.   Assessment and Plan:  History of intermittent anxiety, increased over the last 3-4 months. Exam stable. Agree to refill hydroxyzine, will send in lower dose to prevent so much drowsiness. Also discussed the potential need for SSRI with daily anxiety symptoms, she will update. Referral placed for therapy. GAD 7 score of 16 today.  Follow Up Instructions:  You may take the hydroxyzine 10 mg tablets up to two times daily as needed for anxiety. You may increase up to 20 mg if needed.  You will be contacted regarding your referral to therapy.   Please let us know if you have not been contacted within one week.   It was nice to see you! [name], NP-C    I discussed the assessment and treatment plan with the patient. The patient was provided an opportunity to ask questions and all were answered. The patient agreed with the plan and demonstrated an understanding of the instructions.   The patient was advised to call back or seek an in-person evaluation if the symptoms worsen or if the condition fails to improve as anticipated.    [name], NP    Review of Systems  Respiratory: Negative for shortness of breath.   Cardiovascular: Negative for chest pain.  Psychiatric/Behavioral: Positive for sleep disturbance. The patient is nervous/anxious.        See HPI       Past Medical History:  Diagnosis Date  . Anxiety      Social History   Socioeconomic History  . Marital status: Married    Spouse name: Not on file  . Number of children: Not on file  . Years of education: Not on file  . Highest education level: Not on file  Occupational History  . Not on file  Social Needs  . Financial resource strain: Not on file  . Food insecurity:    Worry: Not on file    Inability: Not on file  . Transportation needs:    Medical: Not on file    Non-medical: Not on file  Tobacco Use  . Smoking status:  Never Smoker  . Smokeless tobacco: Never Used  Substance and Sexual Activity  . Alcohol use: Yes  . Drug use: No  . Sexual activity: Yes    Birth control/protection: Condom  Lifestyle  . Physical activity:    Days per week: Not on file    Minutes per session: Not on file  . Stress: Not on file  Relationships  . Social connections:    Talks on phone: Not on file    Gets together: Not on file    Attends religious service: Not on file    Active member of club or organization: Not on file    Attends meetings of clubs or organizations: Not on file    Relationship status: Not on file  . Intimate partner violence:     Fear of current or ex partner: Not on file    Emotionally abused: Not on file    Physically abused: Not on file    Forced sexual activity: Not on file  Other Topics Concern  . Not on file  Social History Narrative   Married.   4 children.   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     No past surgical history on file.  Family History  Problem Relation Age of Onset  . Diabetes Mother   . Hypertension Mother   . Cancer Maternal Grandfather        lung    No Known Allergies  No current outpatient medications on file prior to visit.   No current facility-administered medications on file prior to visit.     LMP 06/23/2018    Objective:   Physical Exam  Constitutional: She is oriented to person, place, and time. She appears well-nourished.  Respiratory: Effort normal.  Neurological: She is alert and oriented to person, place, and time.  Psychiatric: She has a normal mood and affect.           Assessment & Plan:

## 2018-07-15 NOTE — Assessment & Plan Note (Signed)
History of intermittent anxiety, increased over the last 3-4 months. Exam stable. Agree to refill hydroxyzine, will send in lower dose to prevent so much drowsiness. Also discussed the potential need for SSRI with daily anxiety symptoms, she will update. Referral placed for therapy. GAD 7 score of 16 today.

## 2018-08-09 ENCOUNTER — Telehealth: Payer: Managed Care, Other (non HMO) | Admitting: Physician Assistant

## 2018-08-09 DIAGNOSIS — R059 Cough, unspecified: Secondary | ICD-10-CM

## 2018-08-09 DIAGNOSIS — R05 Cough: Secondary | ICD-10-CM

## 2018-08-09 MED ORDER — BENZONATATE 100 MG PO CAPS
100.0000 mg | ORAL_CAPSULE | Freq: Three times a day (TID) | ORAL | 0 refills | Status: DC
Start: 2018-08-09 — End: 2018-11-16

## 2018-08-09 NOTE — Progress Notes (Signed)
E-Visit for Corona Virus Screening  Based on your current symptoms, you may very well have the virus, however your symptoms are mild. Currently, not all patients are being tested. If the symptoms are mild and there is not a known exposure, performing the test is not indicated.  Coronavirus disease 2019 (COVID-19)is a respiratory illness that can spread from person to person. The virus that causes COVID-19 is a new virus that was first identified in the country of [place] but is now found in multiple other countries and has spread to the [place].  Symptoms associated with the virus are mild to severe fever, cough, and shortness of breath. There is currently no vaccine to protect against COVID-19, and there is no specific antiviral treatment for the virus.   To be considered HIGH RISK for Coronavirus (COVID-19), you have to meet the following criteria:  . Traveled to [place], [place], [place], [place] or [place]; or in the [place] to [place], [place], [place], or [place]; and have fever, cough, and shortness of breath within the last 2 weeks of travel OR  . Been in close contact with a person diagnosed with COVID-19 within the last 2 weeks and have fever, cough, and shortness of breath  . IF YOU DO NOT MEET THESE CRITERIA, YOU ARE CONSIDERED LOW RISK FOR COVID-19.   It is vitally important that if you feel that you have an infection such as this virus or any other virus that you stay home and away from places where you may spread it to others.  You should self-quarantine for 14 days if you have symptoms that could potentially be coronavirus and avoid contact with people age 365 and older.   You can use medication such as A prescription cough medication called Tessalon Perles 100 mg. You may take 1-2 capsules every 8 hours as needed for cough  You may also take acetaminophen (Tylenol) 1000 mg every 8 hours as needed for fever.   Reduce your risk of any infection by using the same  precautions used for avoiding the common cold or flu: Wash your hands often with soap and warm water for at least 20 seconds.  If soap and water are not readily available, use an alcohol-based hand sanitizer with at least 60% alcohol.  If coughing or sneezing, cover your mouth and nose by coughing or sneezing into the elbow areas of your shirt or coat, into a tissue or into your sleeve (not your hands). Avoid shaking hands with others and consider head nods or verbal greetings only.  Avoid touching your eyes,nose, or mouth with unwashed hands. Avoid close contact with people who are sick. Avoid places or events with large numbers of people in one location, like concerts or sporting events. Carefully consider travel plans you have or are making. If you are planning any travel outside or inside the [place], visit the CDC'sTravelers' Health webpagefor the latest health notices. If you have some symptoms but not all symptoms, continue to monitor at home and seek medical attention if your symptoms worsen. If you are having a medical emergency, call 911.  HOME CARE Only take medications as instructed by your medical team. Drink plenty of fluids and get plenty of rest. A steam or ultrasonic humidifier can help if you have congestion.   GET HELP RIGHT AWAY IF: You develop worsening fever. You become short of breath You cough up blood. Your symptoms become more severe MAKE SURE YOU  Understand these instructions. Will watch  your condition. Will get help right away if you are not doing well or get worse.  Your e-visit answers were reviewed by a board certified advanced clinical practitioner to complete your personal care plan.  Depending on the condition, your plan could have included both over the counter or prescription medications.  If there is a problem please reply once you have received a response from your provider. Your safety is important to [place].  If you have drug allergies check your  prescription carefully.    You can use MyChart to ask questions about today's visit, request a non-urgent call back, or ask for a work or school excuse for 24 hours related to this e-Visit. If it has been greater than 24 hours you will need to follow up with your provider, or enter a new e-Visit to address those concerns. You will get an e-mail in the next two days asking about your experience.  I hope that your e-visit has been valuable and will speed your recovery. Thank you for using e-visits.    ===View-only below this line===   ----- Message -----    From: Julie Powers    Sent: 08/09/2018  1:35 PM EDT      To: E-Visit Mailing List Subject: E-Visit Submission: CoronaVirus (COVID-19) Screening  E-Visit Submission: CoronaVirus (COVID-19) Screening --------------------------------  Question: Do you have any of the following?  Answer:   Cough  Question: Do you have any of the following additional symptoms?  Answer:   Headache  Question: Have you had a fever? Answer:   No  Question: Have others in your home or workplace had similar symptoms? Answer:   No  Question: When did your symptoms start? Answer:   08/08/2018  Question: Have you recently visited any of the following countries? Answer:   None of these  Question: If you have traveled anywhere in the last  2 months please document where you have visited: Answer:   no  Question: Have you recently been around others from these countries or visited these countries who have had coughing or fever? Answer:   No  Question: Have you recently been around anyone who has been diagnosed with Corona virus? Answer:   No  Question: Have you been taking any medications? Answer:   Yes  Question: If taking medications for these symptoms, please list the names and whether they are helping or not Answer:   claritin, not helping  Question: Are you treated for any of the following conditions: Asthma, COPD, Diabetes, Renal Failure (on  Dialysis), AIDS, any Neuromuscular disease that effects the clearing of secretions, Heart Failure, or Heart Disease? Answer:   No  Question: Please enter a phone number where you can be reached if we have additional questions about your symptoms Answer:   [phone]  Question: Please list your medication allergies that you may have ? (If 'none' , please list as 'none') Answer:   none  Question: Please list any additional comments  Answer:   Tightness in chest and throat isn't sore but hurts from coughing. Head hurts from coughing.  Question: Are you pregnant? Answer:   I am confident that I am not pregnant  Question: Are you breastfeeding? Answer:   No  A total of 5-10 minutes was spent evaluating this patients questionnaire and formulating a plan of care.

## 2018-11-16 ENCOUNTER — Other Ambulatory Visit: Payer: Self-pay

## 2018-11-16 ENCOUNTER — Ambulatory Visit (INDEPENDENT_AMBULATORY_CARE_PROVIDER_SITE_OTHER): Payer: Managed Care, Other (non HMO) | Admitting: Obstetrics and Gynecology

## 2018-11-16 ENCOUNTER — Other Ambulatory Visit (HOSPITAL_COMMUNITY)
Admission: RE | Admit: 2018-11-16 | Discharge: 2018-11-16 | Disposition: A | Discharge status: Home / Self Care | Payer: Managed Care, Other (non HMO) | Source: Ambulatory Visit | Attending: Obstetrics and Gynecology | Admitting: Obstetrics and Gynecology

## 2018-11-16 ENCOUNTER — Encounter: Payer: Self-pay | Admitting: Obstetrics and Gynecology

## 2018-11-16 VITALS — BP 118/79 | HR 69 | Ht 70.0 in | Wt 229.6 lb

## 2018-11-16 DIAGNOSIS — F419 Anxiety disorder, unspecified: Secondary | ICD-10-CM | POA: Diagnosis not present

## 2018-11-16 DIAGNOSIS — Z01419 Encounter for gynecological examination (general) (routine) without abnormal findings: Secondary | ICD-10-CM

## 2018-11-16 NOTE — Patient Instructions (Signed)
 Preventive Care 21-29 Years Old, Female Preventive care refers to visits with your health care provider and lifestyle choices that can promote health and wellness. This includes:  A yearly physical exam. This may also be called an annual well check.  Regular dental visits and eye exams.  Immunizations.  Screening for certain conditions.  Healthy lifestyle choices, such as eating a healthy diet, getting regular exercise, not using drugs or products that contain nicotine and tobacco, and limiting alcohol use. What can I expect for my preventive care visit? Physical exam Your health care provider will check your:  Height and weight. This may be used to calculate body mass index (BMI), which tells if you are at a healthy weight.  Heart rate and blood pressure.  Skin for abnormal spots. Counseling Your health care provider may ask you questions about your:  Alcohol, tobacco, and drug use.  Emotional well-being.  Home and relationship well-being.  Sexual activity.  Eating habits.  Work and work environment.  Method of birth control.  Menstrual cycle.  Pregnancy history. What immunizations do I need?  Influenza (flu) vaccine  This is recommended every year. Tetanus, diphtheria, and pertussis (Tdap) vaccine  You may need a Td booster every 10 years. Varicella (chickenpox) vaccine  You may need this if you have not been vaccinated. Human papillomavirus (HPV) vaccine  If recommended by your health care provider, you may need three doses over 6 months. Measles, mumps, and rubella (MMR) vaccine  You may need at least one dose of MMR. You may also need a second dose. Meningococcal conjugate (MenACWY) vaccine  One dose is recommended if you are age 19-21 years and a first-year college student living in a residence hall, or if you have one of several medical conditions. You may also need additional booster doses. Pneumococcal conjugate (PCV13) vaccine  You may need  this if you have certain conditions and were not previously vaccinated. Pneumococcal polysaccharide (PPSV23) vaccine  You may need one or two doses if you smoke cigarettes or if you have certain conditions. Hepatitis A vaccine  You may need this if you have certain conditions or if you travel or work in places where you may be exposed to hepatitis A. Hepatitis B vaccine  You may need this if you have certain conditions or if you travel or work in places where you may be exposed to hepatitis B. Haemophilus influenzae type b (Hib) vaccine  You may need this if you have certain conditions. You may receive vaccines as individual doses or as more than one vaccine together in one shot (combination vaccines). Talk with your health care provider about the risks and benefits of combination vaccines. What tests do I need?  Blood tests  Lipid and cholesterol levels. These may be checked every 5 years starting at age 20.  Hepatitis C test.  Hepatitis B test. Screening  Diabetes screening. This is done by checking your blood sugar (glucose) after you have not eaten for a while (fasting).  Sexually transmitted disease (STD) testing.  BRCA-related cancer screening. This may be done if you have a family history of breast, ovarian, tubal, or peritoneal cancers.  Pelvic exam and Pap test. This may be done every 3 years starting at age 21. Starting at age 30, this may be done every 5 years if you have a Pap test in combination with an HPV test. Talk with your health care provider about your test results, treatment options, and if necessary, the need for more   tests. Follow these instructions at home: Eating and drinking   Eat a diet that includes fresh fruits and vegetables, whole grains, lean protein, and low-fat dairy.  Take vitamin and mineral supplements as recommended by your health care provider.  Do not drink alcohol if: ? Your health care provider tells you not to drink. ? You are  pregnant, may be pregnant, or are planning to become pregnant.  If you drink alcohol: ? Limit how much you have to 0-1 drink a day. ? Be aware of how much alcohol is in your drink. In the [place] one drink equals one 12 oz bottle of beer (355 mL), one 5 oz glass of wine (148 mL), or one 1 oz glass of hard liquor (44 mL). Lifestyle  Take daily care of your teeth and gums.  Stay active. Exercise for at least 30 minutes on 5 or more days each week.  Do not use any products that contain nicotine or tobacco, such as cigarettes, e-cigarettes, and chewing tobacco. If you need help quitting, ask your health care provider.  If you are sexually active, practice safe sex. Use a condom or other form of birth control (contraception) in order to prevent pregnancy and STIs (sexually transmitted infections). If you plan to become pregnant, see your health care provider for a preconception visit. What's next?  Visit your health care provider once a year for a well check visit.  Ask your health care provider how often you should have your eyes and teeth checked.  Stay up to date on all vaccines. This information is not intended to replace advice given to you by your health care provider. Make sure you discuss any questions you have with your health care provider. Document Released: 05/26/2001 Document Revised: 12/09/2017 Document Reviewed: 12/09/2017 Elsevier Patient Education  2020 [employer].

## 2018-11-16 NOTE — Progress Notes (Signed)
SUBJECTIVE:  28 y.o.african [place] female for annual routine Pap and checkup. Denies any concerns at this time.  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 No using BC, ok with getting pregnant. Taking multivitamin. Denies taking Prozac. Reports she does not want to take medication and has been using other coping techniques for the height of anxiety the week before her menses. Suggested 200mg  of  L- theanine, herbal supplement.   GAD 7 : Generalized Anxiety Score 11/16/2018 07/15/2018  Nervous, Anxious, on Edge 0 3  Control/stop worrying 0 3  Worry too much - different things 0 3  Trouble relaxing 0 2  Restless 0 2  Easily annoyed or irritable 1 3  Afraid - awful might happen 0 0  Total GAD 7 Score 1 16  Anxiety Difficulty Not difficult at all Somewhat difficult     No current outpatient medications on file.   No current facility-administered medications for this visit.    Allergies: Patient has no known allergies.  Patient's last menstrual period was 10/26/2018.  ROS:  Feeling well. No dyspnea or chest pain on exertion.  No abdominal pain, change in bowel habits, black or bloody stools.  No urinary tract symptoms. GYN ROS: normal menses, no abnormal bleeding, pelvic pain or discharge, no breast pain or new or enlarging lumps on self exam. No neurological complaints.  OBJECTIVE:  The patient appears well, alert, oriented x 3, in no distress. BP 118/79   Pulse 69   Ht 5\' 10"  (1.778 m)   Wt 104.1 kg   LMP 10/26/2018   BMI 32.94 kg/m  ENT normal.  Neck supple. No adenopathy or thyromegaly. PERLA. Lungs are clear, good air entry, no wheezes, rhonchi or rales. S1 and S2 normal, no murmurs, regular rate and rhythm. Abdomen soft without tenderness, guarding, mass or organomegaly. Extremities show no edema, normal peripheral pulses. Neurological is normal, no focal findings.  BREAST EXAM: breasts appear normal, no suspicious masses, no skin or nipple changes or axillary  nodes  PELVIC EXAM: normal external genitalia, vulva, vagina, cervix, uterus and adnexa. Thin prep with HPV collected.   ASSESSMENT:  well woman anxiety  PLAN:  pap smear collected OTC L-Theanine 200mg  po daily PRN for anxiety Labs deferred return in 1 yr for AE or sooner if needed  Silvestre Mesi, SNM

## 2018-11-21 LAB — CYTOLOGY - PAP: Diagnosis: NEGATIVE

## 2019-02-06 ENCOUNTER — Other Ambulatory Visit: Payer: Self-pay | Admitting: Primary Care

## 2019-02-06 DIAGNOSIS — Z0000 Encounter for general adult medical examination without abnormal findings: Secondary | ICD-10-CM

## 2019-02-15 ENCOUNTER — Other Ambulatory Visit (INDEPENDENT_AMBULATORY_CARE_PROVIDER_SITE_OTHER): Payer: Managed Care, Other (non HMO)

## 2019-02-15 DIAGNOSIS — Z0000 Encounter for general adult medical examination without abnormal findings: Secondary | ICD-10-CM | POA: Diagnosis not present

## 2019-02-15 LAB — CBC
HCT: 39.5 % (ref 36.0–46.0)
Hemoglobin: 12.7 g/dL (ref 12.0–15.0)
MCHC: 32.3 g/dL (ref 30.0–36.0)
MCV: 83.7 fl (ref 78.0–100.0)
Platelets: 256 10*3/uL (ref 150.0–400.0)
RBC: 4.71 Mil/uL (ref 3.87–5.11)
RDW: 12.7 % (ref 11.5–15.5)
WBC: 5.4 10*3/uL (ref 4.0–10.5)

## 2019-02-15 LAB — LIPID PANEL
Cholesterol: 161 mg/dL (ref 0–200)
HDL: 60.1 mg/dL (ref >39.00)
LDL Cholesterol: 92 mg/dL (ref 0–99)
NonHDL: 101.25
Total CHOL/HDL Ratio: 3
Triglycerides: 48 mg/dL (ref 0.0–149.0)
VLDL: 9.6 mg/dL (ref 0.0–40.0)

## 2019-02-15 LAB — COMPREHENSIVE METABOLIC PANEL
ALT: 13 U/L (ref 0–35)
AST: 13 U/L (ref 0–37)
Albumin: 4.1 g/dL (ref 3.5–5.2)
Alkaline Phosphatase: 35 U/L — ABNORMAL LOW (ref 39–117)
BUN: 12 mg/dL (ref 6–23)
CO2: 28 mEq/L (ref 19–32)
Calcium: 8.9 mg/dL (ref 8.4–10.5)
Chloride: 105 mEq/L (ref 96–112)
Creatinine, Ser: 0.82 mg/dL (ref 0.40–1.20)
GFR: 99.51 mL/min (ref >60.00)
Glucose, Bld: 98 mg/dL (ref 70–99)
Potassium: 4.1 mEq/L (ref 3.5–5.1)
Sodium: 138 mEq/L (ref 135–145)
Total Bilirubin: 0.5 mg/dL (ref 0.2–1.2)
Total Protein: 6.7 g/dL (ref 6.0–8.3)

## 2019-02-21 ENCOUNTER — Ambulatory Visit (INDEPENDENT_AMBULATORY_CARE_PROVIDER_SITE_OTHER): Payer: Managed Care, Other (non HMO) | Admitting: Primary Care

## 2019-02-21 ENCOUNTER — Other Ambulatory Visit: Payer: Self-pay

## 2019-02-21 DIAGNOSIS — Z0000 Encounter for general adult medical examination without abnormal findings: Secondary | ICD-10-CM

## 2019-02-21 DIAGNOSIS — F411 Generalized anxiety disorder: Secondary | ICD-10-CM | POA: Diagnosis not present

## 2019-02-21 NOTE — Progress Notes (Signed)
Subjective:    Patient ID: Julie Powers, female    DOB: Feb 20, 1991, 28 y.o.   MRN: [id number]  HPI  Julie Powers is a 28 year old female who presents today for complete physical.  Immunizations: -Tetanus: Completed in 2017 -Influenza: Due today, will get at work  Diet: She is eating home cooked meals, take out food. Desserts daily. Drinking little liquids, some water and sodas.  Exercise: She is not exercising  Eye exam: No recent exam Dental exam: Completes annually   Pap Smear: Completed in 2020  BP Readings from Last 3 Encounters:  02/21/19 116/72  11/16/18 118/79  02/07/18 104/64     Review of Systems  Constitutional: Negative for unexpected weight change.  HENT: Negative for rhinorrhea.   Respiratory: Negative for cough and shortness of breath.   Cardiovascular: Negative for chest pain.  Gastrointestinal: Negative for constipation and diarrhea.  Genitourinary: Negative for difficulty urinating and menstrual problem.  Musculoskeletal: Negative for arthralgias and myalgias.  Skin: Negative for rash.  Allergic/Immunologic: Negative for environmental allergies.  Neurological: Negative for dizziness, numbness and headaches.  Psychiatric/Behavioral:       Intermittent anxiety, overall able to manage on her own       Past Medical History:  Diagnosis Date  . Anxiety      Social History   Socioeconomic History  . Marital status: Married    Spouse name: Not on file  . Number of children: Not on file  . Years of education: Not on file  . Highest education level: Not on file  Occupational History  . Not on file  Social Needs  . Financial resource strain: Not on file  . Food insecurity    Worry: Not on file    Inability: Not on file  . Transportation needs    Medical: Not on file    Non-medical: Not on file  Tobacco Use  . Smoking status: Never Smoker  . Smokeless tobacco: Never Used  Substance and Sexual Activity  . Alcohol use: Yes  . Drug use: No  .  Sexual activity: Yes    Birth control/protection: Condom  Lifestyle  . Physical activity    Days per week: Not on file    Minutes per session: Not on file  . Stress: Not on file  Relationships  . Social Musician on phone: Not on file    Gets together: Not on file    Attends religious service: Not on file    Active member of club or organization: Not on file    Attends meetings of clubs or organizations: Not on file    Relationship status: Not on file  . Intimate partner violence    Fear of current or ex partner: Not on file    Emotionally abused: Not on file    Physically abused: Not on file    Forced sexual activity: Not on file  Other Topics Concern  . Not on file  Social History Narrative   Married.   4 children.   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     No past surgical history on file.  Family History  Problem Relation Age of Onset  . Diabetes Mother   . Hypertension Mother   . Cancer Maternal Grandfather        lung    No Known Allergies  No current outpatient medications on file prior to visit.   No current facility-administered medications on file prior to visit.  BP 116/72   Pulse 65   Temp 97.8 F (36.6 C) (Temporal)   Ht 5\' 10"  (1.778 m)   Wt 227 lb (103 kg)   LMP 01/29/2019   SpO2 98%   BMI 32.57 kg/m    Objective:   Physical Exam  Constitutional: She is oriented to person, place, and time. She appears well-nourished.  HENT:  Right Ear: Tympanic membrane and ear canal normal.  Left Ear: Tympanic membrane and ear canal normal.  Mouth/Throat: Oropharynx is clear and moist.  Eyes: Pupils are equal, round, and reactive to light. EOM are normal.  Neck: Neck supple.  Cardiovascular: Normal rate and regular rhythm.  Respiratory: Effort normal and breath sounds normal.  GI: Soft. Bowel sounds are normal. There is no abdominal tenderness.  Musculoskeletal: Normal range of motion.  Neurological: She is alert and oriented to person, place, and  time.  Skin: Skin is warm and dry.  Psychiatric: She has a normal mood and affect.           Assessment & Plan:

## 2019-02-21 NOTE — Assessment & Plan Note (Signed)
Tetanus UTD, will get flu shot at work. Pap smear UTD. Encouraged a healthy diet and regular exercise. Exam today unremarkable. Labs reviewed. Follow up in 1 year for CPE.

## 2019-02-21 NOTE — Patient Instructions (Signed)
Continue exercising. You should be getting 150 minutes of moderate intensity exercise weekly.  It's important to improve your diet by reducing consumption of fast food, fried food, processed snack foods, sugary drinks. Increase consumption of fresh vegetables and fruits, whole grains, water.  Ensure you are drinking 64 ounces of water daily.  It was a pleasure to see you today!   Preventive Care 31-29 Years Old, Female Preventive care refers to visits with your health care provider and lifestyle choices that can promote health and wellness. This includes:  A yearly physical exam. This may also be called an annual well check.  Regular dental visits and eye exams.  Immunizations.  Screening for certain conditions.  Healthy lifestyle choices, such as eating a healthy diet, getting regular exercise, not using drugs or products that contain nicotine and tobacco, and limiting alcohol use. What can I expect for my preventive care visit? Physical exam Your health care provider will check your:  Height and weight. This may be used to calculate body mass index (BMI), which tells if you are at a healthy weight.  Heart rate and blood pressure.  Skin for abnormal spots. Counseling Your health care provider may ask you questions about your:  Alcohol, tobacco, and drug use.  Emotional well-being.  Home and relationship well-being.  Sexual activity.  Eating habits.  Work and work Statistician.  Method of birth control.  Menstrual cycle.  Pregnancy history. What immunizations do I need?  Influenza (flu) vaccine  This is recommended every year. Tetanus, diphtheria, and pertussis (Tdap) vaccine  You may need a Td booster every 10 years. Varicella (chickenpox) vaccine  You may need this if you have not been vaccinated. Human papillomavirus (HPV) vaccine  If recommended by your health care provider, you may need three doses over 6 months. Measles, mumps, and rubella (MMR)  vaccine  You may need at least one dose of MMR. You may also need a second dose. Meningococcal conjugate (MenACWY) vaccine  One dose is recommended if you are age 62-21 years and a first-year college student living in a residence hall, or if you have one of several medical conditions. You may also need additional booster doses. Pneumococcal conjugate (PCV13) vaccine  You may need this if you have certain conditions and were not previously vaccinated. Pneumococcal polysaccharide (PPSV23) vaccine  You may need one or two doses if you smoke cigarettes or if you have certain conditions. Hepatitis A vaccine  You may need this if you have certain conditions or if you travel or work in places where you may be exposed to hepatitis A. Hepatitis B vaccine  You may need this if you have certain conditions or if you travel or work in places where you may be exposed to hepatitis B. Haemophilus influenzae type b (Hib) vaccine  You may need this if you have certain conditions. You may receive vaccines as individual doses or as more than one vaccine together in one shot (combination vaccines). Talk with your health care provider about the risks and benefits of combination vaccines. What tests do I need?  Blood tests  Lipid and cholesterol levels. These may be checked every 5 years starting at age 21.  Hepatitis C test.  Hepatitis B test. Screening  Diabetes screening. This is done by checking your blood sugar (glucose) after you have not eaten for a while (fasting).  Sexually transmitted disease (STD) testing.  BRCA-related cancer screening. This may be done if you have a family history of breast, ovarian,  tubal, or peritoneal cancers.  Pelvic exam and Pap test. This may be done every 3 years starting at age 46. Starting at age 27, this may be done every 5 years if you have a Pap test in combination with an HPV test. Talk with your health care provider about your test results, treatment  options, and if necessary, the need for more tests. Follow these instructions at home: Eating and drinking   Eat a diet that includes fresh fruits and vegetables, whole grains, lean protein, and low-fat dairy.  Take vitamin and mineral supplements as recommended by your health care provider.  Do not drink alcohol if: ? Your health care provider tells you not to drink. ? You are pregnant, may be pregnant, or are planning to become pregnant.  If you drink alcohol: ? Limit how much you have to 0-1 drink a day. ? Be aware of how much alcohol is in your drink. In the [place] one drink equals one 12 oz bottle of beer (355 mL), one 5 oz glass of wine (148 mL), or one 1 oz glass of hard liquor (44 mL). Lifestyle  Take daily care of your teeth and gums.  Stay active. Exercise for at least 30 minutes on 5 or more days each week.  Do not use any products that contain nicotine or tobacco, such as cigarettes, e-cigarettes, and chewing tobacco. If you need help quitting, ask your health care provider.  If you are sexually active, practice safe sex. Use a condom or other form of birth control (contraception) in order to prevent pregnancy and STIs (sexually transmitted infections). If you plan to become pregnant, see your health care provider for a preconception visit. What's next?  Visit your health care provider once a year for a well check visit.  Ask your health care provider how often you should have your eyes and teeth checked.  Stay up to date on all vaccines. This information is not intended to replace advice given to you by your health care provider. Make sure you discuss any questions you have with your health care provider. Document Released: 05/26/2001 Document Revised: 12/09/2017 Document Reviewed: 12/09/2017 Elsevier Patient Education  2020 [employer].

## 2019-02-21 NOTE — Assessment & Plan Note (Signed)
Chronic, overall able to manage on her own. Offered therapy for which she kindly declines. She will update.

## 2019-04-28 ENCOUNTER — Encounter: Payer: Self-pay | Admitting: Family Medicine

## 2019-04-28 ENCOUNTER — Other Ambulatory Visit: Payer: Self-pay

## 2019-04-28 ENCOUNTER — Ambulatory Visit (INDEPENDENT_AMBULATORY_CARE_PROVIDER_SITE_OTHER): Payer: Managed Care, Other (non HMO) | Admitting: Family Medicine

## 2019-04-28 VITALS — Temp 100.2°F | Ht 70.0 in | Wt 225.0 lb

## 2019-04-28 DIAGNOSIS — R030 Elevated blood-pressure reading, without diagnosis of hypertension: Secondary | ICD-10-CM

## 2019-04-28 DIAGNOSIS — U071 COVID-19: Secondary | ICD-10-CM

## 2019-04-28 NOTE — Progress Notes (Signed)
Virtual Visit via Video Note  I connected with Julie Powers on 04/28/19 at  4:00 PM EST by a video enabled telemedicine application and verified that I am speaking with the correct person using two identifiers.  Location: Patient: In her home Provider: [facility] Persons participating in virtual visit: Patient and provider   I discussed the limitations of evaluation and management by telemedicine and the availability of in person appointments. The patient expressed understanding and agreed to proceed.  History of Present Illness: Chief Complaint  Patient presents with  . Follow-up    Patient found out she was Covid postive on 04/27/2019 in very severe pain all over her body.  Patient said that she has been taking Tylenol and other cold meds.  No relief.  This is a 29 year old female.  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  She began having sore throat and cough approximately 3-4 days ago.  She was notified yesterday that she had a positive Covid test done at the [facility].  She has positive contacts with both patients and coworkers.  She currently has a headache across her eyes as well as dry cough.  Low-grade fever.  She denies wheeze, shortness of breath.  She has lost sense of taste and smell.  She is able to eat and drink without difficulty.  She has been taking NyQuil and DayQuil, Goody powders and Tylenol with a little relief.  She felt like the Tylenol is working better before and is not working as well.  She took 1 dose of ibuprofen 400 mg but did not feel that it helped.  Past Medical History:  Diagnosis Date  . Anxiety    History reviewed. No pertinent surgical history. Family History  Problem Relation Age of Onset  . Diabetes Mother   . Hypertension Mother   . Cancer Maternal Grandfather        lung   Social History   Tobacco Use  . Smoking status: Never Smoker  . Smokeless tobacco: Never Used  Substance Use Topics  . Alcohol use: Yes  . Drug use: No       Observations/Objective: Patient is alert and answers questions appropriately.  Visible skin is unremarkable.  She is normally conversive without increased work of breathing, audible wheeze or witnessed cough.  Mood and affect are appropriate.  Temp 100.2 F (37.9 C)   Ht 5\' 10"  (1.778 m)   Wt 225 lb (102.1 kg)   BMI 32.28 kg/m  Blood pressure readings from today 151/102 and 147/122.  BP Readings from Last 3 Encounters:  02/21/19 116/72  11/16/18 118/79  02/07/18 104/64    Assessment and Plan: 1. COVID-19 virus infection -Discussed symptomatic treatment of symptoms as well as follow-up/ER precautions -She was instructed on over-the-counter analgesic use, can add Mucinex if she develops nasal or chest congestion -She was notified to follow-up if she develops increased sputum production, shortness of breath, wheeze or other signs of secondary infection - MYCHART COVID-19 HOME MONITORING PROGRAM - Temperature monitoring; Future  2. Elevated blood pressure reading -Previous readings reviewed and normal.  There was notation of pregnancy-induced hypertension on EMR but patient reports that she only had one elevated reading while pregnant. -Suspect elevated blood pressure likely due to over-the-counter medication as well as pain from headache -Continue to monitor and report elevated readings   02/09/18, FNP-BC  [facility], [facility]  04/28/2019 3:41 PM   Follow Up Instructions:  I discussed the assessment and treatment plan with the patient. The patient was provided an opportunity to ask questions and all were answered. The patient agreed with the plan and demonstrated an understanding of the instructions.   The patient was advised to call back or seek an in-person evaluation if the symptoms worsen or if the condition fails to improve as anticipated.   Elby Beck, FNP

## 2019-05-06 ENCOUNTER — Telehealth: Payer: Self-pay

## 2019-05-06 ENCOUNTER — Encounter (INDEPENDENT_AMBULATORY_CARE_PROVIDER_SITE_OTHER): Payer: Self-pay

## 2019-05-06 NOTE — Telephone Encounter (Signed)
Cough worse yesterday. Cough is more frequent- feels like congestion in lungs but not SOB. Coughing yellow to clear phlegm. Pt has been taking Mucinex DM. Having mild nausea couldn't eat breakfast and feels like she can tolerate fluids. Has drank 16 oz fluids, urine is dark yellow. 98-99% on pulse ox.  Advised pt to discontinue the Mucinex DM die to the cough suppressant. Advised pt to suck on hard candy or cough drops and drinking warm fluids or taking 2 tsp of honey at bedtime. Pt advised to go to [facility] due to nausea and coughing yellow phlegm.  Encouraged pt to drink fluids as tolerated and to work up to bland solid foods (crackers, pretzels, soup, bread or applesauce and boiled starches. Advised pt to call PCP or go to [facility] if unable to tolerated any foods or liquids. Pt advised if she develops severe vomiting (more than 6 times a day and/or >8 hours) and/ or severe abdominal pain-to call 911 and seek tx in the ED. Pt advised to go to [facility] due to nausea and yellow phlegm and to call [facility] before going in for evaluation.  Pt verbalized understanding.

## 2019-05-10 ENCOUNTER — Telehealth: Payer: Self-pay | Admitting: Primary Care

## 2019-05-10 NOTE — Telephone Encounter (Signed)
Paperwork faxed °

## 2019-05-10 NOTE — Telephone Encounter (Signed)
Forms completed and returned to Robin.  

## 2019-05-10 NOTE — Telephone Encounter (Signed)
disability paperwork in Julie Powers's in box  Pt stated first day out of work 04/26/2019 and wanted to return to work 05/15/2019 She still has cough on and off headache and fatigue  She stated she has other members of her household with covid now.  Positive results 05/03/19 they will be out of quarantine 05/13/19 on member is in hospital

## 2019-05-11 NOTE — Telephone Encounter (Signed)
Pt aware.

## 2019-05-11 NOTE — Telephone Encounter (Signed)
Copy for scan Copy for pt 

## 2019-05-15 ENCOUNTER — Other Ambulatory Visit: Payer: Self-pay | Admitting: Family Medicine

## 2019-05-15 ENCOUNTER — Encounter: Payer: Self-pay | Admitting: Family Medicine

## 2019-05-15 DIAGNOSIS — R112 Nausea with vomiting, unspecified: Secondary | ICD-10-CM

## 2019-05-15 MED ORDER — ONDANSETRON 8 MG PO TBDP: 8.0000 mg | ORAL_TABLET | Freq: Three times a day (TID) | ORAL | 0 refills | Status: DC | PRN

## 2019-05-15 NOTE — Telephone Encounter (Signed)
Had virtual visit with Julie Powers on 04/28/2019

## 2019-08-31 ENCOUNTER — Telehealth: Payer: Self-pay | Admitting: Certified Nurse Midwife

## 2019-08-31 NOTE — Telephone Encounter (Signed)
Pt called in and  needs records printed off. I told pt to come in and fill out release form. Pt stated they will be here at 3:30 today

## 2019-09-21 ENCOUNTER — Telehealth: Payer: Self-pay | Admitting: Certified Nurse Midwife

## 2019-09-21 ENCOUNTER — Other Ambulatory Visit (INDEPENDENT_AMBULATORY_CARE_PROVIDER_SITE_OTHER): Payer: Managed Care, Other (non HMO) | Admitting: Certified Nurse Midwife

## 2019-09-21 ENCOUNTER — Other Ambulatory Visit: Payer: Self-pay

## 2019-09-21 DIAGNOSIS — R300 Dysuria: Secondary | ICD-10-CM | POA: Diagnosis not present

## 2019-09-21 LAB — POCT URINALYSIS DIPSTICK
Bilirubin, UA: NEGATIVE
Glucose, UA: NEGATIVE
Ketones, UA: NEGATIVE
Leukocytes, UA: NEGATIVE
Nitrite, UA: NEGATIVE
Protein, UA: NEGATIVE
Spec Grav, UA: 1.015 (ref 1.010–1.025)
Urobilinogen, UA: 0.2 E.U./dL
pH, UA: 6 (ref 5.0–8.0)

## 2019-09-21 NOTE — Telephone Encounter (Signed)
May come in for urine drop off; dip and culture. Contact Sharon when patient checks in ensure she receives that appropriate specimen cup. Thanks, JML

## 2019-09-21 NOTE — Telephone Encounter (Signed)
Patient called in saying she has a uti she took a home test and it showed positive. Patient would like to be seen or drop off a urine sample. Which would you like her to do? I informed her you're next available would be Friday the 18th (unless you wanted to squeeze her in somewhere). What would you like me to do with this patient? Thank you.

## 2019-09-21 NOTE — Telephone Encounter (Signed)
Called patient to inform her she could come in to drop off a urine sample.

## 2019-09-21 NOTE — Progress Notes (Signed)
Patient is c/o dysuria. Is having her menses. U/A neg- urine culture sent to lab.

## 2019-09-24 LAB — URINE CULTURE

## 2019-09-25 ENCOUNTER — Other Ambulatory Visit (INDEPENDENT_AMBULATORY_CARE_PROVIDER_SITE_OTHER): Payer: Managed Care, Other (non HMO) | Admitting: Certified Nurse Midwife

## 2019-09-25 DIAGNOSIS — N390 Urinary tract infection, site not specified: Secondary | ICD-10-CM

## 2019-09-25 DIAGNOSIS — B9620 Unspecified Escherichia coli [E. coli] as the cause of diseases classified elsewhere: Secondary | ICD-10-CM

## 2019-09-25 MED ORDER — NITROFURANTOIN MONOHYD MACRO 100 MG PO CAPS: 100.0000 mg | ORAL_CAPSULE | Freq: Two times a day (BID) | ORAL | 0 refills | Status: DC

## 2019-09-25 NOTE — Progress Notes (Signed)
Rx Macrobid for UTI, see chart.    Gunnar Bulla, CNM [facility], [facility] 09/25/19 10:05 AM

## 2019-11-23 ENCOUNTER — Encounter: Payer: Self-pay | Admitting: Certified Nurse Midwife

## 2019-11-23 ENCOUNTER — Ambulatory Visit (INDEPENDENT_AMBULATORY_CARE_PROVIDER_SITE_OTHER): Payer: Managed Care, Other (non HMO) | Admitting: Certified Nurse Midwife

## 2019-11-23 VITALS — BP 103/69 | HR 61 | Ht 70.0 in | Wt 205.0 lb

## 2019-11-23 DIAGNOSIS — M25551 Pain in right hip: Secondary | ICD-10-CM

## 2019-11-23 DIAGNOSIS — Z01419 Encounter for gynecological examination (general) (routine) without abnormal findings: Secondary | ICD-10-CM | POA: Diagnosis not present

## 2019-11-23 DIAGNOSIS — M25552 Pain in left hip: Secondary | ICD-10-CM

## 2019-11-23 NOTE — Progress Notes (Signed)
ANNUAL PREVENTATIVE CARE GYN  ENCOUNTER NOTE  Subjective:       Julie Powers is a 29 y.o. [phone] female here for a routine annual gynecologic exam.  Current complaints: 1. Bilateral hip pain prior to menses 2. Temporal bruit  Denies difficulty breathing or respiratory distress, chest pain, abdominal pain, excessive vaginal bleeding, dysuria, and leg pain or swelling.   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    Gynecologic History  Patient's last menstrual period was 11/08/2019 (exact date). Period Cycle (Days): 28 Period Duration (Days): 3-5 Period Pattern: Regular Menstrual Flow: Moderate Menstrual Control: Other (Comment) (menstrual cup) Menstrual Control Change Freq (Hours): 7/8 Dysmenorrhea: (!) Mild Dysmenorrhea Symptoms: Other (Comment)  Contraception: Natural Family Planning    Upstream - 11/23/19 9242      Pregnancy Intention Screening   Does the patient want to become pregnant in the next year? No    Does the patient's partner want to become pregnant in the next year? No    Would the patient like to discuss contraceptive options today? No          The pregnancy intention screening data noted above was reviewed. Potential methods of contraception were discussed. The patient elected to proceed with Withdrawal or Other Method.   Last Pap: 11/2018. Results were: Neg/Neg  Obstetric History  OB History  Gravida Para Term Preterm AB Living  3 3 2 1   4   SAB TAB Ectopic Multiple Live Births        1 4    # Outcome Date GA Lbr Len/2nd Weight Sex Delivery Anes PTL Lv  3A Preterm 04/10/16 [redacted]w[redacted]d / 00:09 5 lb 15.9 oz (2.72 kg) M Vag-Spont EPI  LIV  3B Preterm 04/10/16 [redacted]w[redacted]d / 00:29 6 lb 5.4 oz (2.875 kg) F Vag-Breech EPI  LIV  2 Term 2015 [redacted]w[redacted]d   M Vag-Spont  N LIV  1 Term 2012 [redacted]w[redacted]d   F Vag-Spont  N LIV    Past Medical History:  Diagnosis Date  . Anxiety     History reviewed. No pertinent surgical history.  Current Outpatient Medications on  File Prior to Visit  Medication Sig Dispense Refill  . Multiple Vitamins-Minerals (MULTIVITAMIN WITH MINERALS) tablet Take 1 tablet by mouth daily.     No current facility-administered medications on file prior to visit.    No Known Allergies  Social History   Socioeconomic History  . Marital status: Married    Spouse name: Not on file  . Number of children: Not on file  . Years of education: Not on file  . Highest education level: Not on file  Occupational History  . Not on file  Tobacco Use  . Smoking status: Never Smoker  . Smokeless tobacco: Never Used  Vaping Use  . Vaping Use: Never used  Substance and Sexual Activity  . Alcohol use: Yes  . Drug use: No  . Sexual activity: Yes    Birth control/protection: None  Other Topics Concern  . Not on file  Social History Narrative   Married.   4 children.   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    Social Determinants of Health   Financial Resource Strain:   . Difficulty of Paying Living Expenses:   Food Insecurity:   . Worried About [facility] in the Last Year:   . Programme researcher, broadcasting/film/video in the Last Year:   Transportation Needs:   . Barista (Medical):   Freight forwarder  Lack of Transportation (Non-Medical):   Physical Activity:   . Days of Exercise per Week:   . Minutes of Exercise per Session:   Stress:   . Feeling of Stress :   Social Connections:   . Frequency of Communication with Friends and Family:   . Frequency of Social Gatherings with Friends and Family:   . Attends Religious Services:   . Active Member of Clubs or Organizations:   . Attends Banker Meetings:   Marland Kitchen Marital Status:   Intimate Partner Violence:   . Fear of Current or Ex-Partner:   . Emotionally Abused:   Marland Kitchen Physically Abused:   . Sexually Abused:     Family History  Problem Relation Age of Onset  . Diabetes Mother   . Hypertension Mother   . Cancer Maternal Grandfather        lung    The following portions of the patient's  history were reviewed and updated as appropriate: allergies, current medications, past family history, past medical history, past social history, past surgical history and problem list.  Review of Systems  ROS negative except as noted above. Information obtained from patient.    Objective:   BP 103/69   Pulse 61   Ht 5\' 10"  (1.778 m)   Wt 205 lb (93 kg)   LMP 11/08/2019 (Exact Date)   BMI 29.41 kg/m    CONSTITUTIONAL: Well-developed, well-nourished female in no acute distress.   PSYCHIATRIC: Normal mood and affect. Normal behavior. Normal judgment and thought content.  NEUROLGIC: Alert and oriented to person, place, and time. Normal muscle tone coordination. No cranial nerve deficit noted.  HENT:  Normocephalic, atraumatic, External right and left ear normal.  EYES: Conjunctivae and EOM are normal. Pupils are equal and round.   NECK: Normal range of motion, supple, no masses.  Normal thyroid.   SKIN: Skin is warm and dry. No rash noted. Not diaphoretic. No erythema. No pallor.  CARDIOVASCULAR: Normal heart rate noted, regular rhythm, no murmur.  RESPIRATORY: Clear to auscultation bilaterally. Effort and breath sounds normal, no problems with respiration noted.  BREASTS: Symmetric in size. No masses, skin changes, nipple drainage, or lymphadenopathy.  ABDOMEN: Soft, normal bowel sounds, no distention noted.  No tenderness, rebound or guarding.   PELVIC:  External Genitalia: Normal  Vagina: Normal  Cervix: Normal  Uterus: Normal  Adnexa: Normal  MUSCULOSKELETAL: Normal range of motion. No tenderness.  No cyanosis, clubbing, or edema.  2+ distal pulses.  LYMPHATIC: No Axillary, Supraclavicular, or Inguinal Adenopathy.  Assessment:   Annual gynecologic examination 29 y.o.   Contraception: Natural Family Planning   Overweight   Problem List Items Addressed This Visit    None    Visit Diagnoses    Well woman exam    -  Primary   Bilateral hip pain           Plan:   Pap: Not needed  Labs: Not needed  Information given for Dr 26 Hastening at [facility]; MyChart message with article regarding Bruits and Hums of the Head and Neck  Routine preventative health maintenance measures emphasized: Exercise/Diet/Weight control, Tobacco Warnings, Alcohol/Substance use risks and Stress Management; see AVS  Reviewed red flga symptoms and when to call  Return to Clinic - 1 Year for [facility] or sooner if needed   [facility], CNM  [facility], [facility] 11/23/19 8:55 AM

## 2019-11-23 NOTE — Patient Instructions (Signed)
Hip Pain The hip is the joint between the upper legs and the lower pelvis. The bones, cartilage, tendons, and muscles of your hip joint support your body and allow you to move around. Hip pain can range from a minor ache to severe pain in one or both of your hips. The pain may be felt on the inside of the hip joint near the groin, or on the outside near the buttocks and upper thigh. You may also have swelling or stiffness in your hip area. Follow these instructions at home: Managing pain, stiffness, and swelling      If directed, put ice on the painful area. To do this: ? Put ice in a plastic bag. ? Place a towel between your skin and the bag. ? Leave the ice on for 20 minutes, 2-3 times a day.  If directed, apply heat to the affected area as often as told by your health care provider. Use the heat source that your health care provider recommends, such as a moist heat pack or a heating pad. ? Place a towel between your skin and the heat source. ? Leave the heat on for 20-30 minutes. ? Remove the heat if your skin turns bright red. This is especially important if you are unable to feel pain, heat, or cold. You may have a greater risk of getting burned. Activity  Do exercises as told by your health care provider.  Avoid activities that cause pain. General instructions   Take over-the-counter and prescription medicines only as told by your health care provider.  Keep a journal of your symptoms. Write down: ? How often you have hip pain. ? The location of your pain. ? What the pain feels like. ? What makes the pain worse.  Sleep with a pillow between your legs on your most comfortable side.  Keep all follow-up visits as told by your health care provider. This is important. Contact a health care provider if:  You cannot put weight on your leg.  Your pain or swelling continues or gets worse after one week.  It gets harder to walk.  You have a fever. Get help right away  if:  You fall.  You have a sudden increase in pain and swelling in your hip.  Your hip is red or swollen or very tender to touch. Summary  Hip pain can range from a minor ache to severe pain in one or both of your hips.  The pain may be felt on the inside of the hip joint near the groin, or on the outside near the buttocks and upper thigh.  Avoid activities that cause pain.  Write down how often you have hip pain, the location of the pain, what makes it worse, and what it feels like. This information is not intended to replace advice given to you by your health care provider. Make sure you discuss any questions you have with your health care provider. Document Revised: 08/15/2018 Document Reviewed: 08/15/2018 Elsevier Patient Education  [place] 29 Years Old, Female Preventive care refers to visits with your health care provider and lifestyle choices that can promote health and wellness. This includes:  A yearly physical exam. This may also be called an annual well check.  Regular dental visits and eye exams.  Immunizations.  Screening for certain conditions.  Healthy lifestyle choices, such as eating a healthy diet, getting regular exercise, not using drugs or products that contain nicotine and tobacco,  and limiting alcohol use. What can I expect for my preventive care visit? Physical exam Your health care provider will check your:  Height and weight. This may be used to calculate body mass index (BMI), which tells if you are at a healthy weight.  Heart rate and blood pressure.  Skin for abnormal spots. Counseling Your health care provider may ask you questions about your:  Alcohol, tobacco, and drug use.  Emotional well-being.  Home and relationship well-being.  Sexual activity.  Eating habits.  Work and work Statistician.  Method of birth control.  Menstrual cycle.  Pregnancy history. What immunizations do I  need?  Influenza (flu) vaccine  This is recommended every year. Tetanus, diphtheria, and pertussis (Tdap) vaccine  You may need a Td booster every 10 years. Varicella (chickenpox) vaccine  You may need this if you have not been vaccinated. Human papillomavirus (HPV) vaccine  If recommended by your health care provider, you may need three doses over 6 months. Measles, mumps, and rubella (MMR) vaccine  You may need at least one dose of MMR. You may also need a second dose. Meningococcal conjugate (MenACWY) vaccine  One dose is recommended if you are age 52-21 years and a first-year college student living in a residence hall, or if you have one of several medical conditions. You may also need additional booster doses. Pneumococcal conjugate (PCV13) vaccine  You may need this if you have certain conditions and were not previously vaccinated. Pneumococcal polysaccharide (PPSV23) vaccine  You may need one or two doses if you smoke cigarettes or if you have certain conditions. Hepatitis A vaccine  You may need this if you have certain conditions or if you travel or work in places where you may be exposed to hepatitis A. Hepatitis B vaccine  You may need this if you have certain conditions or if you travel or work in places where you may be exposed to hepatitis B. Haemophilus influenzae type b (Hib) vaccine  You may need this if you have certain conditions. You may receive vaccines as individual doses or as more than one vaccine together in one shot (combination vaccines). Talk with your health care provider about the risks and benefits of combination vaccines. What tests do I need?  Blood tests  Lipid and cholesterol levels. These may be checked every 5 years starting at age 15.  Hepatitis C test.  Hepatitis B test. Screening  Diabetes screening. This is done by checking your blood sugar (glucose) after you have not eaten for a while (fasting).  Sexually transmitted disease  (STD) testing.  BRCA-related cancer screening. This may be done if you have a family history of breast, ovarian, tubal, or peritoneal cancers.  Pelvic exam and Pap test. This may be done every 3 years starting at age 45. Starting at age 35, this may be done every 5 years if you have a Pap test in combination with an HPV test. Talk with your health care provider about your test results, treatment options, and if necessary, the need for more tests. Follow these instructions at home: Eating and drinking   Eat a diet that includes fresh fruits and vegetables, whole grains, lean protein, and low-fat dairy.  Take vitamin and mineral supplements as recommended by your health care provider.  Do not drink alcohol if: ? Your health care provider tells you not to drink. ? You are pregnant, may be pregnant, or are planning to become pregnant.  If you drink alcohol: ? Limit how much  you have to 0-1 drink a day. ? Be aware of how much alcohol is in your drink. In the [place] one drink equals one 12 oz bottle of beer (355 mL), one 5 oz glass of wine (148 mL), or one 1 oz glass of hard liquor (44 mL). Lifestyle  Take daily care of your teeth and gums.  Stay active. Exercise for at least 30 minutes on 5 or more days each week.  Do not use any products that contain nicotine or tobacco, such as cigarettes, e-cigarettes, and chewing tobacco. If you need help quitting, ask your health care provider.  If you are sexually active, practice safe sex. Use a condom or other form of birth control (contraception) in order to prevent pregnancy and STIs (sexually transmitted infections). If you plan to become pregnant, see your health care provider for a preconception visit. What's next?  Visit your health care provider once a year for a well check visit.  Ask your health care provider how often you should have your eyes and teeth checked.  Stay up to date on all vaccines. This information is not intended to  replace advice given to you by your health care provider. Make sure you discuss any questions you have with your health care provider. Document Revised: 12/09/2017 Document Reviewed: 12/09/2017 Elsevier Patient Education  2020 [employer].

## 2020-03-10 ENCOUNTER — Telehealth: Payer: Managed Care, Other (non HMO) | Admitting: Physician Assistant

## 2020-03-10 ENCOUNTER — Encounter: Payer: Self-pay | Admitting: Physician Assistant

## 2020-03-10 DIAGNOSIS — N3000 Acute cystitis without hematuria: Secondary | ICD-10-CM | POA: Diagnosis not present

## 2020-03-10 MED ORDER — NITROFURANTOIN MONOHYD MACRO 100 MG PO CAPS: 100.0000 mg | ORAL_CAPSULE | Freq: Two times a day (BID) | ORAL | 0 refills | Status: DC

## 2020-03-10 NOTE — Progress Notes (Signed)
We are sorry that you are not feeling well.  Here is how we plan to help!  Based on what you shared with me it looks like you most likely have a simple urinary tract infection.  A UTI (Urinary Tract Infection) is a bacterial infection of the bladder.  Most cases of urinary tract infections are simple to treat but a key part of your care is to encourage you to drink plenty of fluids and watch your symptoms carefully.  I have prescribed MacroBid 100 mg twice a day for 5 days.  Your symptoms should gradually improve. Call us if the burning in your urine worsens, you develop worsening fever, back pain or pelvic pain or if your symptoms do not resolve after completing the antibiotic.  Urinary tract infections can be prevented by drinking plenty of water to keep your body hydrated.  Also be sure when you wipe, wipe from front to back and don't hold it in!  If possible, empty your bladder every 4 hours.  Your e-visit answers were reviewed by a board certified advanced clinical practitioner to complete your personal care plan.  Depending on the condition, your plan could have included both over the counter or prescription medications.  If there is a problem please reply  once you have received a response from your provider.  Your safety is important to us.  If you have drug allergies check your prescription carefully.    You can use MyChart to ask questions about today's visit, request a non-urgent call back, or ask for a work or school excuse for 24 hours related to this e-Visit. If it has been greater than 24 hours you will need to follow up with your provider, or enter a new e-Visit to address those concerns.   You will get an e-mail in the next two days asking about your experience.  I hope that your e-visit has been valuable and will speed your recovery. Thank you for using e-visits.   I spent 5-10 minutes on review and completion of this note- Tammy Wickliffe PAC  

## 2020-04-09 ENCOUNTER — Telehealth: Payer: Managed Care, Other (non HMO) | Admitting: Nurse Practitioner

## 2020-04-09 DIAGNOSIS — K219 Gastro-esophageal reflux disease without esophagitis: Secondary | ICD-10-CM | POA: Diagnosis not present

## 2020-04-09 MED ORDER — OMEPRAZOLE 20 MG PO CPDR: 20.0000 mg | DELAYED_RELEASE_CAPSULE | Freq: Every day | ORAL | 3 refills | Status: DC

## 2020-04-09 NOTE — Progress Notes (Signed)
We are sorry that you are not feeling well.  Here is how we plan to help!  Based on what you shared with me it looks like you most likely have Gastroesophageal Reflux Disease (GERD)  Gastroesophageal reflux disease (GERD) happens when acid from your stomach flows up into the esophagus.  When acid comes in contact with the esophagus, the acid causes sorenss (inflammation) in the esophagus.  Over time, GERD may create small holes (ulcers) in the lining of the esophagus.  I have prescribed Omeprazole 20 mg one by mouth daily until you follow up with a provider.  Your symptoms should improve in the next day or two.  You can use antacids as needed until symptoms resolve.  Call us if your heartburn worsens, you have trouble swallowing, weight loss, spitting up blood or recurrent vomiting.  Home Care:  May include lifestyle changes such as weight loss, quitting smoking and alcohol consumption  Avoid foods and drinks that make your symptoms worse, such as:  Caffeine or alcoholic drinks  Chocolate  Peppermint or mint flavorings  Garlic and onions  Spicy foods  Citrus fruits, such as oranges, lemons, or limes  Tomato-based foods such as sauce, chili, salsa and pizza  Fried and fatty foods  Avoid lying down for 3 hours prior to your bedtime or prior to taking a nap  Eat small, frequent meals instead of a large meals  Wear loose-fitting clothing.  Do not wear anything tight around your waist that causes pressure on your stomach.  Raise the head of your bed 6 to 8 inches with wood blocks to help you sleep.  Extra pillows will not help.  Seek Help Right Away If:  You have pain in your arms, neck, jaw, teeth or back  Your pain increases or changes in intensity or duration  You develop nausea, vomiting or sweating (diaphoresis)  You develop shortness of breath or you faint  Your vomit is green, yellow, black or looks like coffee grounds or blood  Your stool is red, bloody or  black  These symptoms could be signs of other problems, such as heart disease, gastric bleeding or esophageal bleeding.  Make sure you :  Understand these instructions.  Will watch your condition.  Will get help right away if you are not doing well or get worse.  Your e-visit answers were reviewed by a board certified advanced clinical practitioner to complete your personal care plan.  Depending on the condition, your plan could have included both over the counter or prescription medications.  If there is a problem please reply  once you have received a response from your provider.  Your safety is important to us.  If you have drug allergies check your prescription carefully.    You can use MyChart to ask questions about today's visit, request a non-urgent call back, or ask for a work or school excuse for 24 hours related to this e-Visit. If it has been greater than 24 hours you will need to follow up with your provider, or enter a new e-Visit to address those concerns.  You will get an e-mail in the next two days asking about your experience.  I hope that your e-visit has been valuable and will speed your recovery. Thank you for using e-visits.  5-10 minutes spent reviewing and documenting in chart.  

## 2020-06-26 DIAGNOSIS — M25552 Pain in left hip: Secondary | ICD-10-CM | POA: Diagnosis not present

## 2020-06-26 DIAGNOSIS — S76012A Strain of muscle, fascia and tendon of left hip, initial encounter: Secondary | ICD-10-CM | POA: Diagnosis not present

## 2020-07-24 DIAGNOSIS — S76012A Strain of muscle, fascia and tendon of left hip, initial encounter: Secondary | ICD-10-CM | POA: Diagnosis not present

## 2020-08-07 DIAGNOSIS — S76012A Strain of muscle, fascia and tendon of left hip, initial encounter: Secondary | ICD-10-CM | POA: Diagnosis not present

## 2020-08-26 DIAGNOSIS — R300 Dysuria: Secondary | ICD-10-CM | POA: Diagnosis not present

## 2020-08-26 DIAGNOSIS — N390 Urinary tract infection, site not specified: Secondary | ICD-10-CM | POA: Diagnosis not present

## 2020-09-04 ENCOUNTER — Telehealth: Payer: Self-pay | Admitting: Certified Nurse Midwife

## 2020-09-04 NOTE — Telephone Encounter (Signed)
Pt called and stated she went to urgent care for a UTI she was treated with an antibiotic (she doesn't remember the name) for 5 days.  She completed the antibiotic on Saturday.  Patient states she is still having some symptoms.  Please advise.

## 2020-09-04 NOTE — Telephone Encounter (Signed)
Julie Powers doesn't have any availability until next Thursday.Marland Kitchenany suggestions?

## 2020-09-04 NOTE — Telephone Encounter (Signed)
Per Darol Destine I scheduled her for 11:30 tomorrow 09/05/20 but told her to come at 11:15.

## 2020-09-05 ENCOUNTER — Encounter: Payer: Self-pay | Admitting: Certified Nurse Midwife

## 2020-09-05 ENCOUNTER — Other Ambulatory Visit: Payer: Self-pay

## 2020-09-05 ENCOUNTER — Ambulatory Visit: Payer: BC Managed Care – PPO | Admitting: Certified Nurse Midwife

## 2020-09-05 VITALS — BP 110/71 | HR 63 | Resp 16 | Ht 70.0 in | Wt 222.1 lb

## 2020-09-05 DIAGNOSIS — R300 Dysuria: Secondary | ICD-10-CM | POA: Diagnosis not present

## 2020-09-05 DIAGNOSIS — R319 Hematuria, unspecified: Secondary | ICD-10-CM

## 2020-09-05 DIAGNOSIS — Z87440 Personal history of urinary (tract) infections: Secondary | ICD-10-CM

## 2020-09-05 LAB — POCT URINALYSIS DIPSTICK OB
Bilirubin, UA: NEGATIVE
Blood, UA: POSITIVE
Glucose, UA: NEGATIVE
Ketones, UA: NEGATIVE
Leukocytes, UA: NEGATIVE
Nitrite, UA: NEGATIVE
POC,PROTEIN,UA: NEGATIVE
Spec Grav, UA: 1.01 (ref 1.010–1.025)
Urobilinogen, UA: 0.2 E.U./dL
pH, UA: 7.5 (ref 5.0–8.0)

## 2020-09-05 MED ORDER — SULFAMETHOXAZOLE-TRIMETHOPRIM 800-160 MG PO TABS: 1.0000 | ORAL_TABLET | Freq: Two times a day (BID) | ORAL | 0 refills | Status: DC

## 2020-09-05 MED ORDER — FLUCONAZOLE 150 MG PO TABS: 150.0000 mg | ORAL_TABLET | Freq: Every day | ORAL | 0 refills | Status: DC

## 2020-09-05 MED ORDER — PHENAZOPYRIDINE HCL 200 MG PO TABS: 200.0000 mg | ORAL_TABLET | Freq: Three times a day (TID) | ORAL | 0 refills | Status: DC | PRN

## 2020-09-05 NOTE — Progress Notes (Signed)
GYN ENCOUNTER NOTE  Subjective:       Julie Powers is a 30 y.o. [phone] female here for evaluation of urinary pressure and back pain.   Seen at UC last week and diagnosed with UTI via symptoms, no culture sent.   Denies fever, abdominal pain, and excessive vaginal bleeding.    The following portions of the patient's history were reviewed and updated as appropriate: allergies, current medications, past family history, past medical history, past social history, past surgical history and problem list.  Review of Systems  ROS negative except as noted above. Information obtained from patient.   Objective:   BP 110/71   Pulse 63   Resp 16   Ht 5\' 10"  (1.778 m)   Wt 222 lb 1.6 oz (100.7 kg)   BMI 31.87 kg/m    CONSTITUTIONAL: Well-developed, well-nourished female in no acute distress.   BACK: Negative CVAT  Recent Results (from the past 2160 hour(s))  POC Urinalysis Dipstick OB     Status: Abnormal   Collection Time: 09/05/20 11:45 AM  Result Value Ref Range   Color, UA     Clarity, UA     Glucose, UA Negative Negative   Bilirubin, UA Negative    Ketones, UA Negative    Spec Grav, UA 1.010 1.010 - 1.025   Blood, UA Positive     Comment: Trace   pH, UA 7.5 5.0 - 8.0   POC,PROTEIN,UA Negative Negative, Trace, Small (1+), Moderate (2+), Large (3+), 4+   Urobilinogen, UA 0.2 0.2 or 1.0 E.U./dL   Nitrite, UA Negative    Leukocytes, UA Negative Negative   Appearance     Odor      Assessment:   1. Dysuria  - POC Urinalysis Dipstick OB - Urine Culture  2. History of UTI  - Urine Culture  3. Hematuria, unspecified type  - Urine Culture     Plan:   Urine culture sent, see chart.   Rx Pyridium and Bactrim DS, see orders.   Information regarding urinary probiotic sent via MyChart.   Reviewed red flag symptoms and when to call.   RTC as needed.    09/07/20, CNM [facility], [facility]

## 2020-09-05 NOTE — Patient Instructions (Addendum)
Urinary Tract Infection, Adult  A urinary tract infection (UTI) is an infection of any part of the urinary tract. The urinary tract includes the kidneys, ureters, bladder, and urethra. These organs make, store, and get rid of urine in the body. An upper UTI affects the ureters and kidneys. A lower UTI affects the bladder and urethra. What are the causes? Most urinary tract infections are caused by bacteria in your genital area around your urethra, where urine leaves your body. These bacteria grow and cause inflammation of your urinary tract. What increases the risk? You are more likely to develop this condition if:  You have a urinary catheter that stays in place.  You are not able to control when you urinate or have a bowel movement (incontinence).  You are female and you: ? Use a spermicide or diaphragm for birth control. ? Have low estrogen levels. ? Are pregnant.  You have certain genes that increase your risk.  You are sexually active.  You take antibiotic medicines.  You have a condition that causes your flow of urine to slow down, such as: ? An enlarged prostate, if you are female. ? Blockage in your urethra. ? A kidney stone. ? A nerve condition that affects your bladder control (neurogenic bladder). ? Not getting enough to drink, or not urinating often.  You have certain medical conditions, such as: ? Diabetes. ? A weak disease-fighting system (immunesystem). ? Sickle cell disease. ? Gout. ? Spinal cord injury. What are the signs or symptoms? Symptoms of this condition include:  Needing to urinate right away (urgency).  Frequent urination. This may include small amounts of urine each time you urinate.  Pain or burning with urination.  Blood in the urine.  Urine that smells bad or unusual.  Trouble urinating.  Cloudy urine.  Vaginal discharge, if you are female.  Pain in the abdomen or the lower back. You may also have:  Vomiting or a decreased  appetite.  Confusion.  Irritability or tiredness.  A fever or chills.  Diarrhea. The first symptom in older adults may be confusion. In some cases, they may not have any symptoms until the infection has worsened. How is this diagnosed? This condition is diagnosed based on your medical history and a physical exam. You may also have other tests, including:  Urine tests.  Blood tests.  Tests for STIs (sexually transmitted infections). If you have had more than one UTI, a cystoscopy or imaging studies may be done to determine the cause of the infections. How is this treated? Treatment for this condition includes:  Antibiotic medicine.  Over-the-counter medicines to treat discomfort.  Drinking enough water to stay hydrated. If you have frequent infections or have other conditions such as a kidney stone, you may need to see a health care provider who specializes in the urinary tract (urologist). In rare cases, urinary tract infections can cause sepsis. Sepsis is a life-threatening condition that occurs when the body responds to an infection. Sepsis is treated in the hospital with IV antibiotics, fluids, and other medicines. Follow these instructions at home: Medicines  Take over-the-counter and prescription medicines only as told by your health care provider.  If you were prescribed an antibiotic medicine, take it as told by your health care provider. Do not stop using the antibiotic even if you start to feel better. General instructions  Make sure you: ? Empty your bladder often and completely. Do not hold urine for long periods of time. ? Empty your bladder after   sex. ? Wipe from front to back after urinating or having a bowel movement if you are female. Use each tissue only one time when you wipe.  Drink enough fluid to keep your urine pale yellow.  Keep all follow-up visits. This is important.   Contact a health care provider if:  Your symptoms do not get better after 1-2  days.  Your symptoms go away and then return. Get help right away if:  You have severe pain in your back or your lower abdomen.  You have a fever or chills.  You have nausea or vomiting. Summary  A urinary tract infection (UTI) is an infection of any part of the urinary tract, which includes the kidneys, ureters, bladder, and urethra.  Most urinary tract infections are caused by bacteria in your genital area.  Treatment for this condition often includes antibiotic medicines.  If you were prescribed an antibiotic medicine, take it as told by your health care provider. Do not stop using the antibiotic even if you start to feel better.  Keep all follow-up visits. This is important. This information is not intended to replace advice given to you by your health care provider. Make sure you discuss any questions you have with your health care provider. Document Revised: 11/10/2019 Document Reviewed: 11/10/2019 Elsevier Patient Education  2021 Else[employer] Inc.   Sulfamethoxazole; Trimethoprim, SMX-TMP tablets What is this medicine? SULFAMETHOXAZOLE; TRIMETHOPRIM or SMX-TMP (suhl fuh meth OK suh zohl; trye METH oh prim) is a combination of a sulfonamide antibiotic and a second antibiotic, trimethoprim. It is used to treat or prevent certain kinds of bacterial infections. It will not work for colds, flu, or other viral infections. This medicine may be used for other purposes; ask your health care provider or pharmacist if you have questions. COMMON BRAND NAME(S): Bacter-Aid DS, Bactrim, Bactrim DS, Septra, Septra DS What should I tell my health care provider before I take this medicine? They need to know if you have any of these conditions:  G6PD deficiency  HIV or AIDS  kidney disease  liver disease  low platelets  low red blood cell counts  poor nutrition  stomach or intestine problems like colitis  thyroid disease  an unusual or allergic reaction to sulfamethoxazole,  trimethoprim, sulfa drugs, other drugs, foods, dyes, or preservatives  pregnant or trying to get pregnant  breast-feeding How should I use this medicine? Take this medicine by mouth with a glass of water. Follow the directions on the prescription label. Take your medicine at regular intervals. Do not take it more often than directed. Take all of your medicine as directed even if you think you are better. Do not skip doses or stop your medicine early. Talk to your pediatrician regarding the use of this medicine in children. While this drug may be prescribed for children as [place] as 2 months for selected conditions, precautions do apply. Overdosage: If you think you have taken too much of this medicine contact a poison control center or emergency room at once. NOTE: This medicine is only for you. Do not share this medicine with others. What if I miss a dose? If you miss a dose, take it as soon as you can. If it is almost time for your next dose, take only that dose. Do not take double or extra doses. What may interact with this medicine? Do not take this medicine with any of the following medications:  dofetilide This medicine may also interact with the following medications:  amantadine  birth control pills  certain medicines for blood pressure, heart disease  certain medicines for depression, like amitriptyline  certain medicines for diabetes, like glipizide or glyburide  certain medicines that treat or prevent blood clots like warfarin  cyclosporine  digoxin  diuretics  indomethacin  methotrexate  phenytoin  procainamide  pyrimethamine  zidovudine This list may not describe all possible interactions. Give your health care provider a list of all the medicines, herbs, non-prescription drugs, or dietary supplements you use. Also tell them if you smoke, drink alcohol, or use illegal drugs. Some items may interact with your medicine. What should I watch for while using  this medicine? Tell your health care provider if your symptoms do not start to get better or if they get worse. Do not treat diarrhea with over the counter products. Contact your health care provider if you have diarrhea that lasts more than 2 days or if it is severe and watery. This drug may cause serious skin reactions. They can happen weeks to months after starting the drug. Contact your health care provider right away if you notice fevers or flu-like symptoms with a rash. The rash may be red or purple and then turn into blisters or peeling of the skin. Or, you might notice a red rash with swelling of the face, lips or lymph nodes in your neck or under your arms. This drug can make you more sensitive to the sun. Keep out of the sun. If you cannot avoid being in the sun, wear protective clothing and sunscreen. Do not use sun lamps or tanning beds/booths. Be careful brushing or flossing your teeth or using a toothpick because you may get an infection or bleed more easily. If you have any dental work done, tell your dentist you are receiving this drug. What side effects may I notice from receiving this medicine? Side effects that you should report to your doctor or health care professional as soon as possible:  allergic reactions (skin rash, itching or hives; swelling of the face, lips, or tongue)  bloody or watery diarrhea  cough  heartbeat rhythm changes (trouble breathing; chest pain; dizziness; fast, irregular heartbeat; feeling faint or lightheaded, falls,)  fever  high potassium levels (chest pain; fast, irregular heartbeat; muscle weakness)  kidney injury (trouble passing urine or change in the amount of urine)  liver injury (dark yellow or brown urine; general ill feeling or flu-like symptoms; loss of appetite, right upper belly pain; unusually weak or tired, yellowing of the eyes or skin)  low blood pressure (dizziness; feeling faint or lightheaded, falls; unusually weak or  tired)  low blood sugar (feeling anxious; confusion; dizziness; increased hunger; unusually weak or tired; increased sweating; shakiness; cold, clammy skin; irritable; headache; blurred vision; fast heartbeat; loss of consciousness)  low red blood cell counts (trouble breathing; feeling faint; lightheaded, falls; unusually weak or tired)  rash, fever, and swollen lymph nodes  redness, blistering, peeling, or loosening of the skin, including inside the mouth  trouble breathing  unusual bruising or bleeding Side effects that usually do not require medical attention (report to your doctor or health care professional if they continue or are bothersome):  lack or loss of appetite  nausea  vomiting This list may not describe all possible side effects. Call your doctor for medical advice about side effects. You may report side effects to FDA at 1-800-FDA-1088. Where should I keep my medicine? Keep out of the reach of children. Store between 15 and 25 degrees C (59  to 77 degrees F). Protect from light. Keep the container tightly closed. Throw away any unused drug after the expiration date. NOTE: This sheet is a summary. It may not cover all possible information. If you have questions about this medicine, talk to your doctor, pharmacist, or health care provider.  2021 Elsevier/Gold Standard (2019-03-03 09:54:22)   Hyoscyamine; Methenamine; Methylene Blue; Phenyl; Sodium Biphosphate Oral What is this medicine? HYOSCYAMINE; METHENAMINE; METHYLENE BLUE; PHENYL SALYCILATE; SODIUM BIPHOSPHATE is used to help stop the pain, burning, or discomfort caused by infection or irritation of the urinary tract. This medicine is not an antibiotic. It will not cure a urinary tract infection. This medicine may be used for other purposes; ask your health care provider or pharmacist if you have questions. COMMON BRAND NAME(S): Azuphen MB, Darcalma, Hyolev MB, MD 20, MSP Blu, Phosenamine, Phosphasal, UR N-C,  Uramit, Urelle, Uretron DS, [place] CityUribel, Urimar-T, Urimax, Urin DS, GambierUriSym, Uro WeldonBlue, Uro-L, MilledgevilleUro-MP, EllsworthUstell, MinierUTA, GiselaUTICAP, GreenvilleUtira, ShubutaUtira-C, [place] CityUtrona, ClaflinUtrona-C, ColoradoVILAMIT MB What should I tell my health care provider before I take this medicine? They need to know if you have any of these conditions:  bladder or prostate problems or trouble passing urine  glaucoma  heart disease  myasthenia gravis  stomach problems  an unusual or allergic reaction to hyoscyamine; methenamine; methylene blue; phenyl salicylate; sodium biphosphate, other medicines, foods, dyes, or preservatives  pregnant or trying to get pregnant  breast-feeding How should I use this medicine? Take this medicine by mouth with a glass of water. Follow the directions on the prescription label. Take your medicine at regular intervals. Do not take it more often than directed. Do not stop taking except on your doctor's advice. Talk to your pediatrician regarding the use of this medicine in children. While this drug may be prescribed for children as [place] as 7 years for selected conditions, precautions do apply. Overdosage: If you think you have taken too much of this medicine contact a poison control center or emergency room at once. NOTE: This medicine is only for you. Do not share this medicine with others. What if I miss a dose? If you miss a dose, take it as soon as you can. If it is almost time for your next dose, take only that dose. Do not take double or extra doses. What may interact with this medicine?  antacids  atropine  antihistamines for allergy, cough and cold  certain antibiotics like sulfacetamide and sulfamethoxazole  certain medicines for bladder problems like oxybutynin, tolterodine  certain medicines for blood pressure like hydrochlorothiazide  certain medicines for stomach problems like dicyclomine, hyoscyamine  certain medicines for travel sickness like scopolamine  certain medicines for Parkinson's  disease like benztropine, trihexyphenidyl  ipratropium  ketoconazole  MAOIs like Carbex, Eldepryl, Marplan, Nardil, and Parnate  narcotic medicines for pain This list may not describe all possible interactions. Give your health care provider a list of all the medicines, herbs, non-prescription drugs, or dietary supplements you use. Also tell them if you smoke, drink alcohol, or use illegal drugs. Some items may interact with your medicine. What should I watch for while using this medicine? Tell your doctor or healthcare professional if your symptoms do not start to get better or if they get worse. You may get drowsy or dizzy. Do not drive, use machinery, or do anything that needs mental alertness until you know how this medicine affects you. Do not stand or sit up quickly, especially if you are an older patient. This reduces the risk  of dizzy or fainting spells. Your mouth may get dry. Chewing sugarless gum or sucking hard candy, and drinking plenty of water may help. Contact your doctor if the problem does not go away or is severe. You will need to be on a special diet while taking this medicine. Ask your health care professional how many glasses of water or other fluids to drink each day. Also, ask which foods to include and which to avoid to help keep your urine acidic. Your urine must be acidic for this medicine to work. This medicine may cause dry eyes and blurred vision. If you wear contact lenses you may feel some discomfort. Lubricating drops may help. See your eye doctor if the problem does not go away or is severe. What side effects may I notice from receiving this medicine? Side effects that you should report to your doctor or health care professional as soon as possible:  allergic reactions like skin rash, itching or hives, swelling of the face, lips, or tongue  blurred vision  breathing problems  dizziness  rapid pulse  trouble passing urine or change in the amount of  urine Side effects that usually do not require medical attention (report to your doctor or health care professional if they continue or are bothersome):  blue or blue-green urine or stools  dry mouth  flushing  nausea, vomiting  tiredness This list may not describe all possible side effects. Call your doctor for medical advice about side effects. You may report side effects to FDA at 1-800-FDA-1088. Where should I keep my medicine? Keep out of the reach of children. Store at room temperature between 15 and 30 degrees C (59 and 86 degrees F). Throw away any unused medicine after the expiration date. NOTE: This sheet is a summary. It may not cover all possible information. If you have questions about this medicine, talk to your doctor, pharmacist, or health care provider.  2021 Elsevier/Gold Standard (2015-05-02 10:04:16)

## 2020-09-07 LAB — URINE CULTURE: Organism ID, Bacteria: NO GROWTH

## 2020-09-12 DIAGNOSIS — F413 Other mixed anxiety disorders: Secondary | ICD-10-CM | POA: Diagnosis not present

## 2020-09-19 DIAGNOSIS — F413 Other mixed anxiety disorders: Secondary | ICD-10-CM | POA: Diagnosis not present

## 2020-09-26 DIAGNOSIS — F413 Other mixed anxiety disorders: Secondary | ICD-10-CM | POA: Diagnosis not present

## 2020-10-03 DIAGNOSIS — F413 Other mixed anxiety disorders: Secondary | ICD-10-CM | POA: Diagnosis not present

## 2020-10-10 DIAGNOSIS — F413 Other mixed anxiety disorders: Secondary | ICD-10-CM | POA: Diagnosis not present

## 2020-10-17 DIAGNOSIS — F413 Other mixed anxiety disorders: Secondary | ICD-10-CM | POA: Diagnosis not present

## 2020-10-24 DIAGNOSIS — F413 Other mixed anxiety disorders: Secondary | ICD-10-CM | POA: Diagnosis not present

## 2020-10-31 DIAGNOSIS — R5382 Chronic fatigue, unspecified: Secondary | ICD-10-CM | POA: Diagnosis not present

## 2020-10-31 DIAGNOSIS — R635 Abnormal weight gain: Secondary | ICD-10-CM | POA: Diagnosis not present

## 2020-10-31 DIAGNOSIS — E559 Vitamin D deficiency, unspecified: Secondary | ICD-10-CM | POA: Diagnosis not present

## 2020-10-31 DIAGNOSIS — F413 Other mixed anxiety disorders: Secondary | ICD-10-CM | POA: Diagnosis not present

## 2020-11-07 DIAGNOSIS — F413 Other mixed anxiety disorders: Secondary | ICD-10-CM | POA: Diagnosis not present

## 2020-11-11 DIAGNOSIS — Z1331 Encounter for screening for depression: Secondary | ICD-10-CM | POA: Diagnosis not present

## 2020-11-11 DIAGNOSIS — F331 Major depressive disorder, recurrent, moderate: Secondary | ICD-10-CM | POA: Diagnosis not present

## 2020-11-11 DIAGNOSIS — Z1339 Encounter for screening examination for other mental health and behavioral disorders: Secondary | ICD-10-CM | POA: Diagnosis not present

## 2020-11-11 DIAGNOSIS — F419 Anxiety disorder, unspecified: Secondary | ICD-10-CM | POA: Diagnosis not present

## 2020-11-11 DIAGNOSIS — R5382 Chronic fatigue, unspecified: Secondary | ICD-10-CM | POA: Diagnosis not present

## 2020-11-11 DIAGNOSIS — F3281 Premenstrual dysphoric disorder: Secondary | ICD-10-CM | POA: Diagnosis not present

## 2020-11-16 DIAGNOSIS — F413 Other mixed anxiety disorders: Secondary | ICD-10-CM | POA: Diagnosis not present

## 2020-11-23 DIAGNOSIS — F413 Other mixed anxiety disorders: Secondary | ICD-10-CM | POA: Diagnosis not present

## 2020-11-30 DIAGNOSIS — F413 Other mixed anxiety disorders: Secondary | ICD-10-CM | POA: Diagnosis not present

## 2020-12-02 ENCOUNTER — Encounter: Payer: Managed Care, Other (non HMO) | Admitting: Certified Nurse Midwife

## 2020-12-07 DIAGNOSIS — F413 Other mixed anxiety disorders: Secondary | ICD-10-CM | POA: Diagnosis not present

## 2020-12-21 DIAGNOSIS — F413 Other mixed anxiety disorders: Secondary | ICD-10-CM | POA: Diagnosis not present

## 2021-01-04 DIAGNOSIS — F413 Other mixed anxiety disorders: Secondary | ICD-10-CM | POA: Diagnosis not present

## 2021-01-11 DIAGNOSIS — F413 Other mixed anxiety disorders: Secondary | ICD-10-CM | POA: Diagnosis not present

## 2021-01-18 DIAGNOSIS — F413 Other mixed anxiety disorders: Secondary | ICD-10-CM | POA: Diagnosis not present

## 2021-02-11 ENCOUNTER — Ambulatory Visit (INDEPENDENT_AMBULATORY_CARE_PROVIDER_SITE_OTHER): Payer: BC Managed Care – PPO | Admitting: Primary Care

## 2021-02-11 ENCOUNTER — Other Ambulatory Visit: Payer: Self-pay

## 2021-02-11 ENCOUNTER — Encounter: Payer: Self-pay | Admitting: Primary Care

## 2021-02-11 VITALS — BP 118/70 | HR 77 | Temp 97.8°F | Ht 69.5 in | Wt 226.0 lb

## 2021-02-11 DIAGNOSIS — Z0000 Encounter for general adult medical examination without abnormal findings: Secondary | ICD-10-CM | POA: Diagnosis not present

## 2021-02-11 DIAGNOSIS — F411 Generalized anxiety disorder: Secondary | ICD-10-CM | POA: Diagnosis not present

## 2021-02-11 NOTE — Patient Instructions (Signed)
Start exercising. You should be getting 150 minutes of moderate intensity exercise weekly.  Continue to work on a healthy diet.  It was a pleasure to see you today!  Preventive Care 69-31 Years Old, Female Preventive care refers to lifestyle choices and visits with your health care provider that can promote health and wellness. This includes: A yearly physical exam. This is also called an annual wellness visit. Regular dental and eye exams. Immunizations. Screening for certain conditions. Healthy lifestyle choices, such as: Eating a healthy diet. Getting regular exercise. Not using drugs or products that contain nicotine and tobacco. Limiting alcohol use. What can I expect for my preventive care visit? Physical exam Your health care provider may check your: Height and weight. These may be used to calculate your BMI (body mass index). BMI is a measurement that tells if you are at a healthy weight. Heart rate and blood pressure. Body temperature. Skin for abnormal spots. Counseling Your health care provider may ask you questions about your: Past medical problems. Family's medical history. Alcohol, tobacco, and drug use. Emotional well-being. Home life and relationship well-being. Sexual activity. Diet, exercise, and sleep habits. Work and work Statistician. Access to firearms. Method of birth control. Menstrual cycle. Pregnancy history. What immunizations do I need? Vaccines are usually given at various ages, according to a schedule. Your health care provider will recommend vaccines for you based on your age, medical history, and lifestyle or other factors, such as travel or where you work. What tests do I need? Blood tests Lipid and cholesterol levels. These may be checked every 5 years starting at age 11. Hepatitis C test. Hepatitis B test. Screening Diabetes screening. This is done by checking your blood sugar (glucose) after you have not eaten for a while  (fasting). STD (sexually transmitted disease) testing, if you are at risk. BRCA-related cancer screening. This may be done if you have a family history of breast, ovarian, tubal, or peritoneal cancers. Pelvic exam and Pap test. This may be done every 3 years starting at age 72. Starting at age 82, this may be done every 5 years if you have a Pap test in combination with an HPV test. Talk with your health care provider about your test results, treatment options, and if necessary, the need for more tests. Follow these instructions at home: Eating and drinking  Eat a healthy diet that includes fresh fruits and vegetables, whole grains, lean protein, and low-fat dairy products. Take vitamin and mineral supplements as recommended by your health care provider. Do not drink alcohol if: Your health care provider tells you not to drink. You are pregnant, may be pregnant, or are planning to become pregnant. If you drink alcohol: Limit how much you have to 0-1 drink a day. Be aware of how much alcohol is in your drink. In the [place] one drink equals one 12 oz bottle of beer (355 mL), one 5 oz glass of wine (148 mL), or one 1 oz glass of hard liquor (44 mL). Lifestyle Take daily care of your teeth and gums. Brush your teeth every morning and night with fluoride toothpaste. Floss one time each day. Stay active. Exercise for at least 30 minutes 5 or more days each week. Do not use any products that contain nicotine or tobacco, such as cigarettes, e-cigarettes, and chewing tobacco. If you need help quitting, ask your health care provider. Do not use drugs. If you are sexually active, practice safe sex. Use a condom or other form of protection  to prevent STIs (sexually transmitted infections). If you do not wish to become pregnant, use a form of birth control. If you plan to become pregnant, see your health care provider for a prepregnancy visit. Find healthy ways to cope with stress, such as: Meditation,  yoga, or listening to music. Journaling. Talking to a trusted person. Spending time with friends and family. Safety Always wear your seat belt while driving or riding in a vehicle. Do not drive: If you have been drinking alcohol. Do not ride with someone who has been drinking. When you are tired or distracted. While texting. Wear a helmet and other protective equipment during sports activities. If you have firearms in your house, make sure you follow all gun safety procedures. Seek help if you have been physically or sexually abused. What's next? Go to your health care provider once a year for an annual wellness visit. Ask your health care provider how often you should have your eyes and teeth checked. Stay up to date on all vaccines. This information is not intended to replace advice given to you by your health care provider. Make sure you discuss any questions you have with your health care provider. Document Revised: 06/07/2020 Document Reviewed: 12/09/2017 Elsevier Patient Education  2022 [employer].

## 2021-02-11 NOTE — Assessment & Plan Note (Signed)
Will get influenza vaccine at work. Tetanus UTD. Pap smear UTD, follows with GYN.  Discussed the importance of a healthy diet and regular exercise in order for weight loss, and to reduce the risk of further co-morbidity.  Exam today stable. Labs completed per [facility] MD. She will attach a copy to MyChart.

## 2021-02-11 NOTE — Progress Notes (Signed)
Subjective:    Patient ID: Julie Powers, female    DOB: September 25, 1990, 30 y.o.   MRN: [id number]  HPI  Julie Powers is a very pleasant 30 y.o. female who presents today for complete physical and follow up of chronic conditions.  Immunizations: -Tetanus: 2017 -Influenza: Declines today, will get at work -Covid-19: 2 vaccines  Diet: Fair diet.  Exercise: No regular exercise, active at work.  Eye exam: No recent issues  Dental exam: Completes semi-annually   Pap Smear: Completed in August 2020, follows with GYN.   BP Readings from Last 3 Encounters:  02/11/21 118/70  09/05/20 110/71  11/23/19 103/69      Review of Systems  Constitutional:  Negative for unexpected weight change.  HENT:  Negative for rhinorrhea.   Respiratory:  Negative for shortness of breath.   Cardiovascular:  Negative for chest pain.  Gastrointestinal:  Negative for constipation and diarrhea.  Genitourinary:  Negative for difficulty urinating and menstrual problem.  Musculoskeletal:  Negative for arthralgias and myalgias.  Skin:  Negative for rash.  Allergic/Immunologic: Positive for environmental allergies.  Neurological:  Negative for dizziness and headaches.  Psychiatric/Behavioral:  The patient is not nervous/anxious.         Past Medical History:  Diagnosis Date   Anxiety    Elevated blood pressure complicating pregnancy, antepartum 12/04/2013   Overview:  Patient had one elevated pressure today in clinic, 141/71. Repeat was 114/67.  Will order HELLP and UP/C while in clinic and send patient to labor and delivery triage for serial BP monitoring. If pressures remain elevated or labs are abnormal proceed with IOL    Encounter for supervision of normal pregnancy in multigravida 07/07/2013   Overview:  Boy  Dating: LMP 03/06/13 c/w 20wk [place].  Delivery: anticipate SVD   Feeding: breast  Depression risk: moderate - endorses depressive symptoms following last pregnancy  Contraception: condoms  BMI:  28, advised 15-25lbs weight gain. Total weight gain: 15.967 kg (35 lb 3.2 oz).  Vaccinations: O+, Rubella immune, h/o Varicella, Tdap given   FWB:  Screening: Declined Antenatal testing: not in    Social History   Socioeconomic History   Marital status: Married    Spouse name: Not on file   Number of children: Not on file   Years of education: Not on file   Highest education level: Not on file  Occupational History   Not on file  Tobacco Use   Smoking status: Never   Smokeless tobacco: Never  Vaping Use   Vaping Use: Never used  Substance and Sexual Activity   Alcohol use: Yes   Drug use: No   Sexual activity: Yes    Birth control/protection: None  Other Topics Concern   Not on file  Social History Narrative   Married.   4 children.   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    Social Determinants of Health   Financial Resource Strain: Not on file  Food Insecurity: Not on file  Transportation Needs: Not on file  Physical Activity: Not on file  Stress: Not on file  Social Connections: Not on file  Intimate Partner Violence: Not on file    No past surgical history on file.  Family History  Problem Relation Age of Onset   Diabetes Mother    Hypertension Mother    Cancer Maternal Grandfather        lung    No Known Allergies  No current outpatient medications on file prior to visit.  No current facility-administered medications on file prior to visit.    BP 118/70   Pulse 77   Temp 97.8 F (36.6 C) (Temporal)   Ht 5' 9.5" (1.765 m)   Wt 226 lb (102.5 kg)   LMP 01/28/2021 (Exact Date)   SpO2 98%   Breastfeeding No   BMI 32.90 kg/m  Objective:   Physical Exam HENT:     Right Ear: Tympanic membrane and ear canal normal.     Left Ear: Tympanic membrane and ear canal normal.     Nose: Nose normal.  Eyes:     Conjunctiva/sclera: Conjunctivae normal.     Pupils: Pupils are equal, round, and reactive to light.  Neck:     Thyroid: No thyromegaly.  Cardiovascular:      Rate and Rhythm: Normal rate and regular rhythm.     Heart sounds: No murmur heard. Pulmonary:     Effort: Pulmonary effort is normal.     Breath sounds: Normal breath sounds. No rales.  Abdominal:     General: Bowel sounds are normal.     Palpations: Abdomen is soft.     Tenderness: There is no abdominal tenderness.  Musculoskeletal:        General: Normal range of motion.     Cervical back: Neck supple.  Lymphadenopathy:     Cervical: No cervical adenopathy.  Skin:    General: Skin is warm and dry.     Findings: No rash.  Neurological:     Mental Status: She is alert and oriented to person, place, and time.     Cranial Nerves: No cranial nerve deficit.     Deep Tendon Reflexes: Reflexes are normal and symmetric.  Psychiatric:        Mood and Affect: Mood normal.          Assessment & Plan:      This visit occurred during the SARS-CoV-2 public health emergency.  Safety protocols were in place, including screening questions prior to the visit, additional usage of staff PPE, and extensive cleaning of exam room while observing appropriate contact time as indicated for disinfecting solutions.

## 2021-02-11 NOTE — Assessment & Plan Note (Signed)
Overall well controlled.  No concerns today.  Continue to monitor.  Not on treatment.

## 2021-05-06 ENCOUNTER — Telehealth: Payer: BC Managed Care – PPO | Admitting: Physician Assistant

## 2021-05-06 DIAGNOSIS — J020 Streptococcal pharyngitis: Secondary | ICD-10-CM

## 2021-05-06 MED ORDER — AMOXICILLIN 500 MG PO CAPS: 500.0000 mg | ORAL_CAPSULE | Freq: Two times a day (BID) | ORAL | 0 refills | Status: AC

## 2021-05-06 NOTE — Progress Notes (Signed)
E-Visit for Sore Throat - Strep Symptoms ? ?We are sorry that you are not feeling well.  Here is how we plan to help! ? ?Based on what you have shared with me it is likely that you have strep pharyngitis.  Strep pharyngitis is inflammation and infection in the back of the throat.  This is an infection cause by bacteria and is treated with antibiotics.  I have prescribed Amoxicillin 500 mg twice a day for 10 days. For throat pain, we recommend over the counter oral pain relief medications such as acetaminophen or aspirin, or anti-inflammatory medications such as ibuprofen or naproxen sodium. Topical treatments such as oral throat lozenges or sprays may be used as needed. Strep infections are not as easily transmitted as other respiratory infections, however we still recommend that you avoid close contact with loved ones, especially the very Fair and elderly.  Remember to wash your hands thoroughly throughout the day as this is the number one way to prevent the spread of infection and wipe down door knobs and counters with disinfectant. ? ? ?Home Care: ?Only take medications as instructed by your medical team. ?Complete the entire course of an antibiotic. ?Do not take these medications with alcohol. ?A steam or ultrasonic humidifier can help congestion.  You can place a towel over your head and breathe in the steam from hot water coming from a faucet. ?Avoid close contacts especially the very Groom and the elderly. ?Cover your mouth when you cough or sneeze. ?Always remember to wash your hands. ? ?Get Help Right Away If: ?You develop worsening fever or sinus pain. ?You develop a severe head ache or visual changes. ?Your symptoms persist after you have completed your treatment plan. ? ?Make sure you ?Understand these instructions. ?Will watch your condition. ?Will get help right away if you are not doing well or get worse. ? ? ?Thank you for choosing an e-visit. ? ?Your e-visit answers were reviewed by a board  certified advanced clinical practitioner to complete your personal care plan. Depending upon the condition, your plan could have included both over the counter or prescription medications. ? ?Please review your pharmacy choice. Make sure the pharmacy is open so you can pick up prescription now. If there is a problem, you may contact your provider through MyChart messaging and have the prescription routed to another pharmacy.  Your safety is important to us. If you have drug allergies check your prescription carefully.  ? ?For the next 24 hours you can use MyChart to ask questions about today's visit, request a non-urgent call back, or ask for a work or school excuse. ?You will get an email in the next two days asking about your experience. I hope that your e-visit has been valuable and will speed your recovery. ? ? ?I provided 5 minutes of non face-to-face time during this encounter for chart review and documentation.  ? ?

## 2021-05-21 DIAGNOSIS — L700 Acne vulgaris: Secondary | ICD-10-CM | POA: Diagnosis not present

## 2021-05-21 DIAGNOSIS — L818 Other specified disorders of pigmentation: Secondary | ICD-10-CM | POA: Diagnosis not present

## 2021-09-17 ENCOUNTER — Telehealth (INDEPENDENT_AMBULATORY_CARE_PROVIDER_SITE_OTHER): Payer: Federal, State, Local not specified - PPO | Admitting: Primary Care

## 2021-09-17 ENCOUNTER — Ambulatory Visit: Payer: BC Managed Care – PPO | Admitting: Primary Care

## 2021-09-17 VITALS — Ht 69.5 in | Wt 226.0 lb

## 2021-09-17 DIAGNOSIS — M79606 Pain in leg, unspecified: Secondary | ICD-10-CM | POA: Insufficient documentation

## 2021-09-17 DIAGNOSIS — M79604 Pain in right leg: Secondary | ICD-10-CM

## 2021-09-17 DIAGNOSIS — M79605 Pain in left leg: Secondary | ICD-10-CM | POA: Diagnosis not present

## 2021-09-17 DIAGNOSIS — N943 Premenstrual tension syndrome: Secondary | ICD-10-CM | POA: Diagnosis not present

## 2021-09-17 NOTE — Patient Instructions (Signed)
Schedule a lab appointment as discussed.   It was a pleasure to see you today!

## 2021-09-17 NOTE — Progress Notes (Signed)
Patient ID: Julie Powers, female    DOB: 11/27/1990, 31 y.o.   MRN: [id number]  Virtual visit completed through [employer], a video enabled telemedicine application. Due to national recommendations of social distancing due to COVID-19, a virtual visit is felt to be most appropriate for this patient at this time. Reviewed limitations, risks, security and privacy concerns of performing a virtual visit and the availability of in person appointments. I also reviewed that there may be a patient responsible charge related to this service. The patient agreed to proceed.   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, office Persons participating in this virtual visit: patient, provider   If any vitals were documented, they were collected by patient at home unless specified below.    Ht 5' 9.5" (1.765 m)   Wt 226 lb (102.5 kg)   LMP 09/16/2021   BMI 32.90 kg/m    CC: Mood Swings, Leg pain Subjective:   HPI: Julie Powers is a 31 y.o. female with a history of GAD presenting on 09/17/2021 to discuss mood changes and lower extremity pain.   Follow-up (Patient having issues with mood around time of cycle. ) and Leg Pain (B/L leg pain for a few months. Taking ibuprofen daily. )  1) Mood Changes: Occur 1-2 weeks prior to menses. Symptoms include facial acne, weight gain, increased food cravings - specifically sweets, irritability. Symptoms improve within 1-2 days of starting menses.   Last week was the worst as she felt a new symptom of hopelessness.   Symptoms are improved with exercise and activity, but her legs are causing her a lot of pain. Mood symptoms began a few years ago but have become more bothersome this year.   Previously following with OB/GYN who prescribed SSRI treatment (doesn't recall the name), but she never started this treatment as she does not like to take medication daily. She has never tried birth control.   2) Chronic Lower Extremity Pain: Pain  occurs daily, and is located to the to her bilateral lateral feet through her calves with radiation to her knees. She feels most of her pain in the legs. She describes her pain as achy/throbbing. She works on her feet for 8 hours at work, will go home and is on her feet for another 3-4 hours.   She wears compression socks at work with some improvement. She denies calf swelling, injury, back pain, numbness. She is taking Ibuprofen 400-800 mg three to four times weekly with improvement.      Relevant past medical, surgical, family and social history reviewed and updated as indicated. Interim medical history since our last visit reviewed. Allergies and medications reviewed and updated. No outpatient medications prior to visit.   No facility-administered medications prior to visit.     Per HPI unless specifically indicated in ROS section below Review of Systems  Cardiovascular:  Negative for leg swelling.  Genitourinary:  Positive for menstrual problem.  Musculoskeletal:  Negative for back pain.       Lower extremity pain  Psychiatric/Behavioral:  The patient is nervous/anxious.        See HPI  Objective:  Ht 5' 9.5" (1.765 m)   Wt 226 lb (102.5 kg)   LMP 09/16/2021   BMI 32.90 kg/m   Wt Readings from Last 3 Encounters:  09/17/21 226 lb (102.5 kg)  02/11/21 226 lb (102.5 kg)  09/05/20 222 lb 1.6 oz (100.7 kg)       Physical exam: General: Alert  and oriented x 3, no distress, does not appear sickly  Pulmonary: Speaks in complete sentences without increased work of breathing, no cough during visit.  Psychiatric: Normal mood, thought content, and behavior.     Results for orders placed or performed in visit on 09/05/20  Urine Culture   Specimen: Urine   UR  Result Value Ref Range   Urine Culture, Routine Final report    Organism ID, Bacteria No growth   POC Urinalysis Dipstick OB  Result Value Ref Range   Color, UA     Clarity, UA     Glucose, UA Negative Negative    Bilirubin, UA Negative    Ketones, UA Negative    Spec Grav, UA 1.010 1.010 - 1.025   Blood, UA Positive    pH, UA 7.5 5.0 - 8.0   POC,PROTEIN,UA Negative Negative, Trace, Small (1+), Moderate (2+), Large (3+), 4+   Urobilinogen, UA 0.2 0.2 or 1.0 E.U./dL   Nitrite, UA Negative    Leukocytes, UA Negative Negative   Appearance     Odor     Assessment & Plan:   Problem List Items Addressed This Visit       Other   PMS (premenstrual syndrome) - Primary    Discussed options for treatment including SSRI, Buspar, birth control.  She elects for birth control patches.  Will obtain urine pregnancy test. Once returned we will initiate patches. We discussed potential side effects, when to start,etc.        Relevant Orders   POCT urine pregnancy   Lower extremity pain    Unclear etiology. Exam today is limited given virtual platform.  Checking labs to rule out metabolic cause. Consider lumbar imaging.  Lower suspicion for DVT or vascular cause.  Consider podiatry evaluation, EMG test, vs physical therapy, neurology evaluation.   Continue compression socks. Discussed to limit NSAID use.       Relevant Orders   TSH   Vitamin B12   CBC   Comprehensive metabolic panel     No orders of the defined types were placed in this encounter.  Orders Placed This Encounter  Procedures   TSH    Standing Status:   Future    Standing Expiration Date:   09/18/2022   Vitamin B12    Standing Status:   Future    Standing Expiration Date:   09/18/2022   CBC    Standing Status:   Future    Standing Expiration Date:   09/18/2022   Comprehensive metabolic panel    Standing Status:   Future    Standing Expiration Date:   09/18/2022   POCT urine pregnancy    Standing Status:   Future    Standing Expiration Date:   10/17/2021    I discussed the assessment and treatment plan with the patient. The patient was provided an opportunity to ask questions and all were answered. The patient agreed with  the plan and demonstrated an understanding of the instructions. The patient was advised to call back or seek an in-person evaluation if the symptoms worsen or if the condition fails to improve as anticipated.  Follow up plan:  Schedule a lab appointment as discussed.   It was a pleasure to see you today!   Pleas Koch, NP

## 2021-09-17 NOTE — Assessment & Plan Note (Signed)
Discussed options for treatment including SSRI, Buspar, birth control.  She elects for birth control patches.  Will obtain urine pregnancy test. Once returned we will initiate patches. We discussed potential side effects, when to start,etc.

## 2021-09-17 NOTE — Assessment & Plan Note (Addendum)
Unclear etiology. Exam today is limited given virtual platform.  Checking labs to rule out metabolic cause. Consider lumbar imaging.  Lower suspicion for DVT or vascular cause.  Consider podiatry evaluation, EMG test, vs physical therapy, neurology evaluation.   Continue compression socks. Discussed to limit NSAID use.

## 2021-09-19 ENCOUNTER — Other Ambulatory Visit (INDEPENDENT_AMBULATORY_CARE_PROVIDER_SITE_OTHER): Payer: Federal, State, Local not specified - PPO

## 2021-09-19 DIAGNOSIS — N943 Premenstrual tension syndrome: Secondary | ICD-10-CM

## 2021-09-19 DIAGNOSIS — M79604 Pain in right leg: Secondary | ICD-10-CM | POA: Diagnosis not present

## 2021-09-19 DIAGNOSIS — M79605 Pain in left leg: Secondary | ICD-10-CM

## 2021-09-19 LAB — COMPREHENSIVE METABOLIC PANEL
ALT: 14 U/L (ref 0–35)
AST: 14 U/L (ref 0–37)
Albumin: 4.3 g/dL (ref 3.5–5.2)
Alkaline Phosphatase: 38 U/L — ABNORMAL LOW (ref 39–117)
BUN: 15 mg/dL (ref 6–23)
CO2: 28 mEq/L (ref 19–32)
Calcium: 9.6 mg/dL (ref 8.4–10.5)
Chloride: 103 mEq/L (ref 96–112)
Creatinine, Ser: 0.8 mg/dL (ref 0.40–1.20)
GFR: 97.84 mL/min (ref >60.00)
Glucose, Bld: 85 mg/dL (ref 70–99)
Potassium: 4.4 mEq/L (ref 3.5–5.1)
Sodium: 138 mEq/L (ref 135–145)
Total Bilirubin: 0.6 mg/dL (ref 0.2–1.2)
Total Protein: 6.9 g/dL (ref 6.0–8.3)

## 2021-09-19 LAB — CBC
HCT: 40.3 % (ref 36.0–46.0)
Hemoglobin: 13 g/dL (ref 12.0–15.0)
MCHC: 32.2 g/dL (ref 30.0–36.0)
MCV: 84.1 fl (ref 78.0–100.0)
Platelets: 249 10*3/uL (ref 150.0–400.0)
RBC: 4.79 Mil/uL (ref 3.87–5.11)
RDW: 13.5 % (ref 11.5–15.5)
WBC: 4.7 10*3/uL (ref 4.0–10.5)

## 2021-09-19 LAB — VITAMIN B12: Vitamin B-12: 517 pg/mL (ref 211–911)

## 2021-09-19 LAB — POCT URINE PREGNANCY: Preg Test, Ur: NEGATIVE

## 2021-09-19 LAB — TSH: TSH: 2.5 u[IU]/mL (ref 0.35–5.50)

## 2021-09-20 ENCOUNTER — Other Ambulatory Visit: Payer: Self-pay | Admitting: Primary Care

## 2021-09-20 DIAGNOSIS — N943 Premenstrual tension syndrome: Secondary | ICD-10-CM

## 2021-09-20 MED ORDER — NORELGESTROMIN-ETH ESTRADIOL 150-35 MCG/24HR TD PTWK: MEDICATED_PATCH | TRANSDERMAL | 3 refills | Status: DC

## 2021-10-09 ENCOUNTER — Ambulatory Visit (INDEPENDENT_AMBULATORY_CARE_PROVIDER_SITE_OTHER): Payer: Federal, State, Local not specified - PPO | Admitting: Podiatry

## 2021-10-09 DIAGNOSIS — Q666 Other congenital valgus deformities of feet: Secondary | ICD-10-CM

## 2021-10-09 MED ORDER — METHYLPREDNISOLONE 4 MG PO TBPK: ORAL_TABLET | ORAL | 0 refills | Status: DC

## 2021-10-09 MED ORDER — MELOXICAM 15 MG PO TABS: 15.0000 mg | ORAL_TABLET | Freq: Every day | ORAL | 0 refills | Status: DC

## 2021-10-09 NOTE — Progress Notes (Signed)
Subjective:  Patient ID: Julie Powers, female    DOB: 1990/06/29,  MRN: [id number]  Chief Complaint  Patient presents with   Foot Orthotics    31 y.o. female presents with the above complaint.  Patient presents with complaint of flatfoot deformity.  Patient states she has bilateral arch and heel pain that is progressive gotten worse.  Patient states that it is because of the type of work that she does on her foot.  Pain scale is 4 out of 10 is more dull achy in nature.  She would like to discuss getting orthotics/inserts.  She has not seen MRIs prior to seeing me she denies any other acute complaints.   Review of Systems: Negative except as noted in the HPI. Denies N/V/F/Ch.  Past Medical History:  Diagnosis Date   Anxiety    Elevated blood pressure complicating pregnancy, antepartum 12/04/2013   Overview:  Patient had one elevated pressure today in clinic, 141/71. Repeat was 114/67.  Will order HELLP and UP/C while in clinic and send patient to labor and delivery triage for serial BP monitoring. If pressures remain elevated or labs are abnormal proceed with IOL    Encounter for supervision of normal pregnancy in multigravida 07/07/2013   Overview:  Boy  Dating: LMP 03/06/13 c/w 20wk [place].  Delivery: anticipate SVD   Feeding: breast  Depression risk: moderate - endorses depressive symptoms following last pregnancy  Contraception: condoms  BMI: 28, advised 15-25lbs weight gain. Total weight gain: 15.967 kg (35 lb 3.2 oz).  Vaccinations: O+, Rubella immune, h/o Varicella, Tdap given   FWB:  Screening: Declined Antenatal testing: not in    Current Outpatient Medications:    meloxicam (MOBIC) 15 MG tablet, Take 1 tablet (15 mg total) by mouth daily., Disp: 30 tablet, Rfl: 0   methylPREDNISolone (MEDROL DOSEPAK) 4 MG TBPK tablet, Use as directed, Disp: 1 each, Rfl: 0   norelgestromin-ethinyl estradiol Burr Medico) 150-35 MCG/24HR transdermal patch, Place one patch to your skin once weekly., Disp: 9  patch, Rfl: 3  Social History   Tobacco Use  Smoking Status Never  Smokeless Tobacco Never    No Known Allergies Objective:  There were no vitals filed for this visit. There is no height or weight on file to calculate BMI. Constitutional Well developed. Well nourished.  Vascular Dorsalis pedis pulses palpable bilaterally. Posterior tibial pulses palpable bilaterally. Capillary refill normal to all digits.  No cyanosis or clubbing noted. Pedal hair growth normal.  Neurologic Normal speech. Oriented to person, place, and time. Epicritic sensation to light touch grossly present bilaterally.  Dermatologic Nails well groomed and normal in appearance. No open wounds. No skin lesions.  Orthopedic: Gait examination shows pes planovalgus foot structure with unable to create the arch with dorsiflexion of the hallux.  Too many toe signs noted calcaneal valgus noted.  Check is retained in neutral position with single and double heel raise.   Radiographs: None Assessment:   1. Pes planovalgus    Plan:  Patient was evaluated and treated and all questions answered.  Bilateral arch/lateral foot pain with underlying pes planovalgus deformity -All questions and concerns were discussed with the patient in extensive detail given that she has pes planovalgus foot structure in the setting of work on her feet I believe she will benefit from custom-made orthotics help control the hindfoot motion support the arches of her feet and take the stress away.  I discussed this with the patient she states understanding -She was casted for orthotics  No follow-ups on file.

## 2021-11-05 ENCOUNTER — Other Ambulatory Visit: Payer: Self-pay | Admitting: Podiatry

## 2021-11-10 DIAGNOSIS — M9905 Segmental and somatic dysfunction of pelvic region: Secondary | ICD-10-CM | POA: Diagnosis not present

## 2021-11-10 DIAGNOSIS — M545 Low back pain, unspecified: Secondary | ICD-10-CM | POA: Diagnosis not present

## 2021-11-10 DIAGNOSIS — M9903 Segmental and somatic dysfunction of lumbar region: Secondary | ICD-10-CM | POA: Diagnosis not present

## 2021-11-10 DIAGNOSIS — M9904 Segmental and somatic dysfunction of sacral region: Secondary | ICD-10-CM | POA: Diagnosis not present

## 2021-11-14 DIAGNOSIS — M545 Low back pain, unspecified: Secondary | ICD-10-CM | POA: Diagnosis not present

## 2021-11-14 DIAGNOSIS — M9905 Segmental and somatic dysfunction of pelvic region: Secondary | ICD-10-CM | POA: Diagnosis not present

## 2021-11-14 DIAGNOSIS — M9903 Segmental and somatic dysfunction of lumbar region: Secondary | ICD-10-CM | POA: Diagnosis not present

## 2021-11-14 DIAGNOSIS — M9904 Segmental and somatic dysfunction of sacral region: Secondary | ICD-10-CM | POA: Diagnosis not present

## 2021-11-18 ENCOUNTER — Ambulatory Visit: Payer: Federal, State, Local not specified - PPO | Admitting: Podiatry

## 2021-11-19 DIAGNOSIS — M9903 Segmental and somatic dysfunction of lumbar region: Secondary | ICD-10-CM | POA: Diagnosis not present

## 2021-11-19 DIAGNOSIS — M9904 Segmental and somatic dysfunction of sacral region: Secondary | ICD-10-CM | POA: Diagnosis not present

## 2021-11-19 DIAGNOSIS — M9905 Segmental and somatic dysfunction of pelvic region: Secondary | ICD-10-CM | POA: Diagnosis not present

## 2021-11-19 DIAGNOSIS — M545 Low back pain, unspecified: Secondary | ICD-10-CM | POA: Diagnosis not present

## 2021-11-20 ENCOUNTER — Ambulatory Visit (INDEPENDENT_AMBULATORY_CARE_PROVIDER_SITE_OTHER): Payer: Federal, State, Local not specified - PPO | Admitting: Podiatry

## 2021-11-20 ENCOUNTER — Ambulatory Visit (INDEPENDENT_AMBULATORY_CARE_PROVIDER_SITE_OTHER): Payer: Federal, State, Local not specified - PPO

## 2021-11-20 DIAGNOSIS — Q666 Other congenital valgus deformities of feet: Secondary | ICD-10-CM

## 2021-11-20 NOTE — Progress Notes (Signed)
Patient presents today to pick up custom molded foot orthotics, diagnosed with pes planovalgus by Dr. Allena Katz.   Orthotics were dispensed and fit was satisfactory. Reviewed instructions for break-in and wear. Written instructions given to patient.  Patient will follow up as needed.   [facility] - order # [id number]

## 2021-11-20 NOTE — Progress Notes (Signed)
Patient was here to pick up orthotics.  However they are still in [place].  She will pick them up in [place].

## 2021-11-28 DIAGNOSIS — M9903 Segmental and somatic dysfunction of lumbar region: Secondary | ICD-10-CM | POA: Diagnosis not present

## 2021-11-28 DIAGNOSIS — M9905 Segmental and somatic dysfunction of pelvic region: Secondary | ICD-10-CM | POA: Diagnosis not present

## 2021-11-28 DIAGNOSIS — M545 Low back pain, unspecified: Secondary | ICD-10-CM | POA: Diagnosis not present

## 2021-11-28 DIAGNOSIS — M9904 Segmental and somatic dysfunction of sacral region: Secondary | ICD-10-CM | POA: Diagnosis not present

## 2021-12-03 ENCOUNTER — Encounter: Payer: Self-pay | Admitting: Primary Care

## 2021-12-03 ENCOUNTER — Ambulatory Visit: Payer: Federal, State, Local not specified - PPO | Admitting: Primary Care

## 2021-12-03 DIAGNOSIS — R21 Rash and other nonspecific skin eruption: Secondary | ICD-10-CM | POA: Diagnosis not present

## 2021-12-03 DIAGNOSIS — M9905 Segmental and somatic dysfunction of pelvic region: Secondary | ICD-10-CM | POA: Diagnosis not present

## 2021-12-03 DIAGNOSIS — M9904 Segmental and somatic dysfunction of sacral region: Secondary | ICD-10-CM | POA: Diagnosis not present

## 2021-12-03 DIAGNOSIS — M545 Low back pain, unspecified: Secondary | ICD-10-CM | POA: Diagnosis not present

## 2021-12-03 DIAGNOSIS — L989 Disorder of the skin and subcutaneous tissue, unspecified: Secondary | ICD-10-CM | POA: Diagnosis not present

## 2021-12-03 DIAGNOSIS — L300 Nummular dermatitis: Secondary | ICD-10-CM | POA: Insufficient documentation

## 2021-12-03 DIAGNOSIS — M9903 Segmental and somatic dysfunction of lumbar region: Secondary | ICD-10-CM | POA: Diagnosis not present

## 2021-12-03 MED ORDER — TRIAMCINOLONE ACETONIDE 0.5 % EX OINT: 1.0000 | TOPICAL_OINTMENT | Freq: Two times a day (BID) | CUTANEOUS | 0 refills | Status: DC

## 2021-12-03 NOTE — Patient Instructions (Signed)
Apply the triamcinolone ointment twice daily to your leg for about one week, then use as needed.  Stop using the antifungal cream. Start OTC hydrocortisone cream once daily for one week.  Consider setting up with a dermatologist.   It was a pleasure to see you today!

## 2021-12-03 NOTE — Assessment & Plan Note (Signed)
Appears benign, however, we discussed that a dermatology evaluation would be reasonable. She agrees and will inquire.   Rx for triamcinolone 0.5% cream sent to pharmacy to use BID PRN. Discussed to avoid use on face.

## 2021-12-03 NOTE — Progress Notes (Signed)
Subjective:    Patient ID: Julie Powers, female    DOB: [birth year], 31 y.o.   MRN: [id number]  Rash Pertinent negatives include no congestion.    Julie Powers is a very pleasant 31 y.o. female who presents today to discuss several concerns.  1) Rash: Acute to the right upper and lower eyelid for the last two weeks. Her rash is itchy. She applied an OTC ointment (doesn't recall) initially and noticed swelling to the upper and lower lids the following day. Her swelling has subsided since she discontinued the ointment. Since then she's noticed dry skin to the upper and lower lids. She was noticing crusting with yellow drainage but suspects this was secondary to one of the topical agents she was using.   She denies visual changes, erythema, new make up, new facial cream, left eye symptoms, symptoms to the conjunctiva, post nasal drip, nasal congestion.   She's recently been using an OTC antifungal cream for the last two days which helped with swelling and itching.   2) Skin Lesion: Chronic for months to the left anterior mid shin. Intermittently itchy. She's applied OTC hydrocortisone cream with temporary improvement in itching. She does notice flares after a shower sometimes. Over time she's noticed hyperpigmentation surrounding the lesion. She denies growth of the lesion itself.     Review of Systems  HENT:  Negative for congestion and postnasal drip.   Eyes:  Negative for visual disturbance.  Skin:  Positive for color change and rash.         Past Medical History:  Diagnosis Date   Anxiety    Elevated blood pressure complicating pregnancy, antepartum 12/04/2013   Overview:  Patient had one elevated pressure today in clinic, 141/71. Repeat was 114/67.  Will order HELLP and UP/C while in clinic and send patient to labor and delivery triage for serial BP monitoring. If pressures remain elevated or labs are abnormal proceed with IOL    Encounter for supervision of normal pregnancy  in multigravida 07/07/2013   Overview:  Boy  Dating: LMP 03/06/13 c/w 20wk [place].  Delivery: anticipate SVD   Feeding: breast  Depression risk: moderate - endorses depressive symptoms following last pregnancy  Contraception: condoms  BMI: 28, advised 15-25lbs weight gain. Total weight gain: 15.967 kg (35 lb 3.2 oz).  Vaccinations: O+, Rubella immune, h/o Varicella, Tdap given   FWB:  Screening: Declined Antenatal testing: not in    Social History   Socioeconomic History   Marital status: Married    Spouse name: Not on file   Number of children: Not on file   Years of education: Not on file   Highest education level: Not on file  Occupational History   Not on file  Tobacco Use   Smoking status: Never   Smokeless tobacco: Never  Vaping Use   Vaping Use: Never used  Substance and Sexual Activity   Alcohol use: Yes   Drug use: No   Sexual activity: Yes    Birth control/protection: None  Other Topics Concern   Not on file  Social History Narrative   Married.   4 children.   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    Social Determinants of Health   Financial Resource Strain: Not on file  Food Insecurity: Not on file  Transportation Needs: Not on file  Physical Activity: Not on file  Stress: Not on file  Social Connections: Not on file  Intimate Partner Violence: Not on file    History reviewed.  No pertinent surgical history.  Family History  Problem Relation Age of Onset   Diabetes Mother    Hypertension Mother    Cancer Maternal Grandfather        lung    No Known Allergies  Current Outpatient Medications on File Prior to Visit  Medication Sig Dispense Refill   meloxicam (MOBIC) 15 MG tablet TAKE 1 TABLET (15 MG TOTAL) BY MOUTH DAILY. 30 tablet 0   norelgestromin-ethinyl estradiol [facility]) 150-35 MCG/24HR transdermal patch Place one patch to your skin once weekly. 9 patch 3   No current facility-administered medications on file prior to visit.    BP 118/70   Pulse 99   Temp 98.6  F (37 C) (Oral)   Ht 5' 9.5" (1.765 m)   Wt 215 lb (97.5 kg)   SpO2 99%   BMI 31.29 kg/m  Objective:   Physical Exam Constitutional:      General: She is not in acute distress.    Appearance: She is not ill-appearing.  Eyes:     General:        Right eye: No discharge.        Left eye: No discharge.     Conjunctiva/sclera: Conjunctivae normal.      Comments: Dried skin/scaling noted to right upper eye lid and below right lower eye lid. No eye lid swelling.   Skin:    Findings: Lesion present. Rash is not crusting, macular, pustular, scaling or vesicular.          Comments: 1 cm, oval shaped, flat area of white pigmentation to left anterior mid shin. 1 inch area of light hyperpigmentation surrounding lesion.            Assessment & Plan:   Problem List Items Addressed This Visit       Musculoskeletal and Integument   Skin lesion of left lower extremity    Appears benign, however, we discussed that a dermatology evaluation would be reasonable. She agrees and will inquire.   Rx for triamcinolone 0.5% cream sent to pharmacy to use BID PRN. Discussed to avoid use on face.       Relevant Medications   triamcinolone ointment (KENALOG) 0.5 %   Rash and nonspecific skin eruption    To right eyelids. Does not appear fungal. No infection.   Stop using antifungal treatment. Start OTC hydrocortisone BID x 1 week. Then stop.  She will also inquire with dermatology.            [name], NP

## 2021-12-03 NOTE — Assessment & Plan Note (Signed)
To right eyelids. Does not appear fungal. No infection.   Stop using antifungal treatment. Start OTC hydrocortisone BID x 1 week. Then stop.  She will also inquire with dermatology.

## 2021-12-08 DIAGNOSIS — M545 Low back pain, unspecified: Secondary | ICD-10-CM | POA: Diagnosis not present

## 2021-12-08 DIAGNOSIS — M9903 Segmental and somatic dysfunction of lumbar region: Secondary | ICD-10-CM | POA: Diagnosis not present

## 2021-12-08 DIAGNOSIS — M9905 Segmental and somatic dysfunction of pelvic region: Secondary | ICD-10-CM | POA: Diagnosis not present

## 2021-12-08 DIAGNOSIS — M9904 Segmental and somatic dysfunction of sacral region: Secondary | ICD-10-CM | POA: Diagnosis not present

## 2021-12-11 ENCOUNTER — Other Ambulatory Visit: Payer: Self-pay | Admitting: Podiatry

## 2021-12-31 DIAGNOSIS — M545 Low back pain, unspecified: Secondary | ICD-10-CM | POA: Diagnosis not present

## 2021-12-31 DIAGNOSIS — M9905 Segmental and somatic dysfunction of pelvic region: Secondary | ICD-10-CM | POA: Diagnosis not present

## 2021-12-31 DIAGNOSIS — M9904 Segmental and somatic dysfunction of sacral region: Secondary | ICD-10-CM | POA: Diagnosis not present

## 2021-12-31 DIAGNOSIS — M9903 Segmental and somatic dysfunction of lumbar region: Secondary | ICD-10-CM | POA: Diagnosis not present

## 2022-01-28 ENCOUNTER — Ambulatory Visit
Admission: EM | Admit: 2022-01-28 | Discharge: 2022-01-28 | Disposition: A | Discharge status: Home / Self Care | Payer: Federal, State, Local not specified - PPO | Attending: Urgent Care | Admitting: Urgent Care

## 2022-01-28 DIAGNOSIS — S61210A Laceration without foreign body of right index finger without damage to nail, initial encounter: Secondary | ICD-10-CM | POA: Diagnosis not present

## 2022-01-28 NOTE — ED Provider Notes (Signed)
[facility]    CSN: [id number] Arrival date & time: 01/28/22  1430      History   Chief Complaint Chief Complaint  Patient presents with   Laceration    Entered by patient    HPI Julie Powers is a 31 y.o. female.    Laceration   Patient presents with laceration to R index finger suffered today when she cut her hand on sharpe edge of garage door handle.  Past Medical History:  Diagnosis Date   Anxiety    Elevated blood pressure complicating pregnancy, antepartum 12/04/2013   Overview:  Patient had one elevated pressure today in clinic, 141/71. Repeat was 114/67.  Will order HELLP and UP/C while in clinic and send patient to labor and delivery triage for serial BP monitoring. If pressures remain elevated or labs are abnormal proceed with IOL    Encounter for supervision of normal pregnancy in multigravida 07/07/2013   Overview:  Boy  Dating: LMP 03/06/13 c/w 20wk [place].  Delivery: anticipate SVD   Feeding: breast  Depression risk: moderate - endorses depressive symptoms following last pregnancy  Contraception: condoms  BMI: 28, advised 15-25lbs weight gain. Total weight gain: 15.967 kg (35 lb 3.2 oz).  Vaccinations: O+, Rubella immune, h/o Varicella, Tdap given   FWB:  Screening: Declined Antenatal testing: not in    Patient Active Problem List   Diagnosis Date Noted   Skin lesion of left lower extremity 12/03/2021   Rash and nonspecific skin eruption 12/03/2021   PMS (premenstrual syndrome) 09/17/2021   Lower extremity pain 09/17/2021   GAD (generalized anxiety disorder) 07/15/2018   Preventative health care 02/07/2018    History reviewed. No pertinent surgical history.  OB History     Gravida  3   Para  3   Term  2   Preterm  1   AB      Living  4      SAB      IAB      Ectopic      Multiple  1   Live Births  4            Home Medications    Prior to Admission medications   Medication Sig Start Date End Date Taking? Authorizing  Provider  meloxicam (MOBIC) 15 MG tablet TAKE 1 TABLET (15 MG TOTAL) BY MOUTH DAILY. 12/11/21   Candelaria Stagers, DPM  norelgestromin-ethinyl estradiol [facility] [facility]) 150-35 MCG/24HR transdermal patch Place one patch to your skin once weekly. 09/20/21   [name], NP  triamcinolone ointment (KENALOG) 0.5 % Apply 1 Application topically 2 (two) times daily. As needed. 12/03/21   [name], NP    Family History Family History  Problem Relation Age of Onset   Diabetes Mother    Hypertension Mother    Cancer Maternal Grandfather        lung    Social History Social History   Tobacco Use   Smoking status: Never   Smokeless tobacco: Never  Vaping Use   Vaping Use: Never used  Substance Use Topics   Alcohol use: Yes   Drug use: No     Allergies   Patient has no known allergies.   Review of Systems Review of Systems   Physical Exam Triage Vital Signs ED Triage Vitals  Enc Vitals Group     BP 01/28/22 1438 135/88     Pulse Rate 01/28/22 1438 88     Resp 01/28/22 1438 18  Temp 01/28/22 1438 98.5 F (36.9 C)     Temp Source 01/28/22 1438 Oral     SpO2 01/28/22 1438 97 %     Weight --      Height --      Head Circumference --      Peak Flow --      Pain Score 01/28/22 1437 5     Pain Loc --      Pain Edu? --      Excl. in GC? --    No data found.  Updated Vital Signs BP 135/88 (BP Location: Left Arm)   Pulse 88   Temp 98.5 F (36.9 C) (Oral)   Resp 18   LMP 01/14/2022 (Approximate)   SpO2 97%   Visual Acuity Right Eye Distance:   Left Eye Distance:   Bilateral Distance:    Right Eye Near:   Left Eye Near:    Bilateral Near:     Physical Exam Vitals reviewed.  Musculoskeletal:       Hands:  Skin:    General: Skin is warm and dry.     Findings: Laceration present.  Neurological:     General: No focal deficit present.     Mental Status: She is alert and oriented to person, place, and time.  Psychiatric:        Mood and Affect: Mood  normal.        Behavior: Behavior normal.      UC Treatments / Results  Labs (all labs ordered are listed, but only abnormal results are displayed) Labs Reviewed - No data to display  EKG   Radiology No results found.  Procedures Laceration Repair  Date/Time: 01/28/2022 3:07 PM  Performed by: Charma Igo, FNP Authorized by: Charma Igo, FNP   Consent:    Consent obtained:  Verbal   Consent given by:  Patient   Risks, benefits, and alternatives were discussed: yes     Risks discussed:  Infection, pain and poor cosmetic result Universal protocol:    Procedure explained and questions answered to patient or proxy's satisfaction: yes     Relevant documents present and verified: yes     Test results available: no     Imaging studies available: no     Required blood products, implants, devices, and special equipment available: no     Site/side marked: no     Immediately prior to procedure, a time out was called: yes     Patient identity confirmed:  Verbally with patient Anesthesia:    Anesthesia method:  Local infiltration   Local anesthetic:  Lidocaine 1% WITH epi Laceration details:    Location:  Finger   Finger location:  R index finger   Length (cm):  1.5   Depth (mm):  3 Pre-procedure details:    Preparation:  Patient was prepped and draped in usual sterile fashion Exploration:    Limited defect created (wound extended): no     Hemostasis achieved with:  Epinephrine   Imaging outcome: foreign body not noted     Wound exploration: entire depth of wound visualized     Wound extent: areolar tissue violated     Wound extent: no nerve damage noted and no tendon damage noted     Contaminated: no   Treatment:    Area cleansed with:  Povidone-iodine   Amount of cleaning:  Standard   Irrigation solution:  Sterile saline   Irrigation volume:  10 ml   Irrigation method:  Pressure  wash   Visualized foreign bodies/material removed: no     Debridement:   None   Undermining:  None   Scar revision: no   Skin repair:    Repair method:  Sutures   Suture size:  4-0   Suture material:  Prolene   Suture technique:  Simple interrupted   Number of sutures:  5 Approximation:    Approximation:  Close Repair type:    Repair type:  Simple Post-procedure details:    Dressing:  Open (no dressing)   Procedure completion:  Tolerated Comments:     Advised to return in 7 to 10 days to have sutures removed.  Also advised to keep wound completely clean and dry x1 day.  After which okay to get wet but do not soak or no prolonged wetness.  (including critical care time)  Medications Ordered in UC Medications - No data to display  Initial Impression / Assessment and Plan / UC Course  I have reviewed the triage vital signs and the nursing notes.  Pertinent labs & imaging results that were available during my care of the patient were reviewed by me and considered in my medical decision making (see chart for details).   See procedure note   Final Clinical Impressions(s) / UC Diagnoses   Final diagnoses:  None   Discharge Instructions   None    ED Prescriptions   None    PDMP not reviewed this encounter.   Rose Phi, Sn[place] 01/28/22 1510

## 2022-01-28 NOTE — ED Triage Notes (Signed)
Pt. Presents to UC stating she cut her right index finger on her garage door today.

## 2022-01-28 NOTE — Discharge Instructions (Addendum)
Return in 7 to 10 days for suture removal. Follow up here or with your primary care provider if you develop signs and symptoms of infection.

## 2022-12-16 DIAGNOSIS — L300 Nummular dermatitis: Secondary | ICD-10-CM | POA: Diagnosis not present

## 2022-12-23 ENCOUNTER — Encounter: Payer: Federal, State, Local not specified - PPO | Admitting: Primary Care

## 2023-02-15 DIAGNOSIS — M47812 Spondylosis without myelopathy or radiculopathy, cervical region: Secondary | ICD-10-CM | POA: Diagnosis not present

## 2023-02-15 DIAGNOSIS — M7541 Impingement syndrome of right shoulder: Secondary | ICD-10-CM | POA: Diagnosis not present

## 2023-02-24 DIAGNOSIS — M5412 Radiculopathy, cervical region: Secondary | ICD-10-CM | POA: Diagnosis not present

## 2023-03-18 DIAGNOSIS — M5412 Radiculopathy, cervical region: Secondary | ICD-10-CM | POA: Diagnosis not present

## 2023-04-01 DIAGNOSIS — M5412 Radiculopathy, cervical region: Secondary | ICD-10-CM | POA: Diagnosis not present

## 2023-04-13 DIAGNOSIS — M5412 Radiculopathy, cervical region: Secondary | ICD-10-CM | POA: Diagnosis not present

## 2023-05-24 ENCOUNTER — Ambulatory Visit (INDEPENDENT_AMBULATORY_CARE_PROVIDER_SITE_OTHER): Payer: Federal, State, Local not specified - PPO | Admitting: Dermatology

## 2023-05-24 ENCOUNTER — Encounter: Payer: Self-pay | Admitting: Dermatology

## 2023-05-24 VITALS — BP 135/90

## 2023-05-24 DIAGNOSIS — M79606 Pain in leg, unspecified: Secondary | ICD-10-CM

## 2023-05-24 DIAGNOSIS — L300 Nummular dermatitis: Secondary | ICD-10-CM

## 2023-05-24 DIAGNOSIS — L280 Lichen simplex chronicus: Secondary | ICD-10-CM

## 2023-05-24 MED ORDER — TACROLIMUS 0.1 % EX OINT: TOPICAL_OINTMENT | Freq: Two times a day (BID) | CUTANEOUS | 0 refills | Status: DC

## 2023-05-24 MED ORDER — CLOBETASOL PROPIONATE 0.05 % EX OINT: 1.0000 | TOPICAL_OINTMENT | Freq: Two times a day (BID) | CUTANEOUS | 0 refills | Status: DC

## 2023-05-24 NOTE — Patient Instructions (Addendum)
 Hello Nelsa,  Thank you for visiting us  today.   Here is a summary of the key instructions from today's consultation:  Diagnosis: Nummular Eczema with secondary Lichenification.   Medications:   Clobetasol : Apply a thin layer twice daily for 2 weeks. After this period, take a break to prevent skin thinning.   Tacrolimus : After the 2-week break from Clobetasol , apply Tacrolimus  for 2 weeks. Continue alternating between Clobetasol  and Tacrolimus  every 2 weeks.  General Care:   Ensure to massage the medication into the skin for about 10 minutes to enhance absorption and improve blood flow.   Maintain hydration and use lotion regularly to manage dry skin.  Follow-Up Care:   We will monitor your progress and touch base every 6 months to assess the effectiveness of the treatment.   If the condition recurs, begin using Clobetasol  immediately and schedule an appointment if necessary.  Additional Recommendations:   For leg pain, consider taking Epsom salt baths and staying hydrated.   Use compression stockings during long periods of standing to help with blood circulation and prevent varicose veins.  Please follow the instructions carefully and do not hesitate to contact our office if you have any questions or concerns. We are here to support you on your journey to better skin health.  Warm regards,  Dr. Louana Roup, Dermatology    Important Information  Due to recent changes in healthcare laws, you may see results of your pathology and/or laboratory studies on MyChart before the doctors have had a chance to review them. We understand that in some cases there may be results that are confusing or concerning to you. Please understand that not all results are received at the same time and often the doctors may need to interpret multiple results in order to provide you with the best plan of care or course of treatment. Therefore, we ask that you please give us  2 business days to thoroughly  review all your results before contacting the office for clarification. Should we see a critical lab result, you will be contacted sooner.   If You Need Anything After Your Visit  If you have any questions or concerns for your doctor, please call our main line at [phone] If no one answers, please leave a voicemail as directed and we will return your call as soon as possible. Messages left after 4 pm will be answered the following business day.   You may also send us  a message via MyChart. We typically respond to MyChart messages within 1-2 business days.  For prescription refills, please ask your pharmacy to contact our office. Our fax number is [phone].  If you have an urgent issue when the clinic is closed that cannot wait until the next business day, you can page your doctor at the number below.    Please note that while we do our best to be available for urgent issues outside of office hours, we are not available 24/7.   If you have an urgent issue and are unable to reach us , you may choose to seek medical care at your doctor's office, retail clinic, urgent care center, or emergency room.  If you have a medical emergency, please immediately call 911 or go to the emergency department. In the event of inclement weather, please call our main line at [phone] for an update on the status of any delays or closures.  Dermatology Medication Tips: Please keep the boxes that topical medications come in in order to help keep track of  the instructions about where and how to use these. Pharmacies typically print the medication instructions only on the boxes and not directly on the medication tubes.   If your medication is too expensive, please contact our office at [phone] or send us  a message through MyChart.   We are unable to tell what your co-pay for medications will be in advance as this is different depending on your insurance coverage. However, we may be able to find a  substitute medication at lower cost or fill out paperwork to get insurance to cover a needed medication.   If a prior authorization is required to get your medication covered by your insurance company, please allow us  1-2 business days to complete this process.  Drug prices often vary depending on where the prescription is filled and some pharmacies may offer cheaper prices.  The website www.goodrx.com contains coupons for medications through different pharmacies. The prices here do not account for what the cost may be with help from insurance (it may be cheaper with your insurance), but the website can give you the price if you did not use any insurance.  - You can print the associated coupon and take it with your prescription to the pharmacy.  - You may also stop by our office during regular business hours and pick up a GoodRx coupon card.  - If you need your prescription sent electronically to a different pharmacy, notify our office through [facility] or by phone at [phone]

## 2023-05-24 NOTE — Progress Notes (Signed)
 New Patient Visit   Subjective  Julie Powers is a 33 y.o. female who presents for the following: New Pt   Patient states she has lesion located at the lower left leg that she would like to have examined. Patient reports the areas have been there for 1 year. She reports the areas are bothersome.Patient rates irritation (itchy) 10 out of 10. She states that the areas have not spread. Patient reports she has previously been treated for these areas by PCP. Rx TMC 0.5% but it did not help much. Patient denies Hx of bx. Patient denies family history of skin cancer(s).   The following portions of the chart were reviewed this encounter and updated as appropriate: medications, allergies, medical history  Review of Systems:  No other skin or systemic complaints except as noted in HPI or Assessment and Plan.  Objective  Well appearing patient in no apparent distress; mood and affect are within normal limits.   A focused examination was performed of the following areas: lower L leg    Relevant exam findings are noted in the Assessment and Plan.         Assessment & Plan   LICHEN SIMPLEX CHRONICUS/NUMMULAR ECZEMA Exam: Excoriated lichenified papules and/or plaques at lower left leg  Lichen Simplex Chronicus Secondary to Nummular Eczema Assessment: Patient presents with a pruritic lesion on the leg, persisting for approximately one year. Initially a small spot, it has expanded into a lichenified plaque. Previous treatments with triamcinolone and hydrocortisone were ineffective. Diagnosis of lichen simplex chronicus due to nummular eczema is made, with chronic rubbing and scratching leading to skin thickening, darkening, and a cobblestone-like appearance.   Plan:  Prescribe clobetasol (level one steroid), to be applied twice daily for 2 weeks. After 2 weeks, switch to tacrolimus for 2 weeks.   Alternate between clobetasol and tacrolimus every 2 weeks for approximately 2 months. Apply  medications in thin layers and massage for 10 minutes to enhance blood flow.   If condition recurs, resume clobetasol immediately. Follow-up in 6 months, expecting 90% clearance in 8 weeks.  Leg Pain Assessment: Patient reports occasional leg pain, especially after prolonged standing or activities like hiking, described as an achy sensation in the lower leg. Differential diagnoses include vascular issues (possibly related to prolonged standing and blood pooling) or neurological causes (potentially sciatic nerve pain from lower back issues). The leg pain is not believed to be directly related to the skin condition but may be exacerbating the itching.  Plan: Recommend hot baths with Epsom salt for symptom relief. Advise staying hydrated and elevating legs when at home. Suggest using compression stockings at work. I recommended follow up with  primary care physician for further evaluation and possible imaging. Continue use of over-the-counter anti-inflammatories as needed. NUMMULAR ECZEMA   Related Medications clobetasol ointment (TEMOVATE) 0.05 % Apply 1 Application topically 2 (two) times daily. Use for 2 weeks. tacrolimus (PROTOPIC) 0.1 % ointment Apply topically 2 (two) times daily. Alternate use with clobetasol every 2 weeks. LICHEN SIMPLEX CHRONICUS   Related Medications clobetasol ointment (TEMOVATE) 0.05 % Apply 1 Application topically 2 (two) times daily. Use for 2 weeks. tacrolimus (PROTOPIC) 0.1 % ointment Apply topically 2 (two) times daily. Alternate use with clobetasol every 2 weeks.  Return in about 6 months (around 11/21/2023) for LICHEN SIMPLEX CHRONICUS/NUMMULAR ECZEMA.    Documentation: I have reviewed the above documentation for accuracy and completeness, and I agree with the above.   I, Shirron Marcha Solders, CMA, am acting  as scribe for Langston Reusing, DO.   Langston Reusing, DO

## 2023-05-26 DIAGNOSIS — S39012A Strain of muscle, fascia and tendon of lower back, initial encounter: Secondary | ICD-10-CM | POA: Diagnosis not present

## 2023-05-26 DIAGNOSIS — M546 Pain in thoracic spine: Secondary | ICD-10-CM | POA: Diagnosis not present

## 2023-06-11 DIAGNOSIS — M546 Pain in thoracic spine: Secondary | ICD-10-CM | POA: Diagnosis not present

## 2023-06-21 ENCOUNTER — Other Ambulatory Visit: Payer: Self-pay | Admitting: Dermatology

## 2023-06-21 DIAGNOSIS — L280 Lichen simplex chronicus: Secondary | ICD-10-CM

## 2023-06-21 DIAGNOSIS — L300 Nummular dermatitis: Secondary | ICD-10-CM

## 2023-07-02 ENCOUNTER — Telehealth: Admitting: Physician Assistant

## 2023-07-02 DIAGNOSIS — R3989 Other symptoms and signs involving the genitourinary system: Secondary | ICD-10-CM

## 2023-07-02 MED ORDER — NITROFURANTOIN MONOHYD MACRO 100 MG PO CAPS: 100.0000 mg | ORAL_CAPSULE | Freq: Two times a day (BID) | ORAL | 0 refills | Status: DC

## 2023-07-02 NOTE — Progress Notes (Signed)

## 2023-07-08 ENCOUNTER — Other Ambulatory Visit (HOSPITAL_COMMUNITY)
Admission: RE | Admit: 2023-07-08 | Discharge: 2023-07-08 | Disposition: A | Discharge status: Home / Self Care | Source: Ambulatory Visit | Attending: Primary Care | Admitting: Primary Care

## 2023-07-08 ENCOUNTER — Ambulatory Visit (INDEPENDENT_AMBULATORY_CARE_PROVIDER_SITE_OTHER)
Admission: RE | Admit: 2023-07-08 | Discharge: 2023-07-08 | Disposition: A | Discharge status: Home / Self Care | Source: Ambulatory Visit | Attending: Primary Care | Admitting: Primary Care

## 2023-07-08 ENCOUNTER — Ambulatory Visit (INDEPENDENT_AMBULATORY_CARE_PROVIDER_SITE_OTHER): Admitting: Primary Care

## 2023-07-08 VITALS — BP 134/86 | HR 70 | Temp 100.1°F | Ht 69.5 in | Wt 228.0 lb

## 2023-07-08 DIAGNOSIS — Z0000 Encounter for general adult medical examination without abnormal findings: Secondary | ICD-10-CM | POA: Diagnosis not present

## 2023-07-08 DIAGNOSIS — R0602 Shortness of breath: Secondary | ICD-10-CM

## 2023-07-08 DIAGNOSIS — F411 Generalized anxiety disorder: Secondary | ICD-10-CM | POA: Diagnosis not present

## 2023-07-08 DIAGNOSIS — Z13220 Encounter for screening for lipoid disorders: Secondary | ICD-10-CM

## 2023-07-08 DIAGNOSIS — Z124 Encounter for screening for malignant neoplasm of cervix: Secondary | ICD-10-CM

## 2023-07-08 DIAGNOSIS — L300 Nummular dermatitis: Secondary | ICD-10-CM

## 2023-07-08 DIAGNOSIS — R059 Cough, unspecified: Secondary | ICD-10-CM | POA: Diagnosis not present

## 2023-07-08 NOTE — Assessment & Plan Note (Signed)
 Follow with dermatology. Office notes reviewed from February 2025.  Continue tacrolimus 0.1% ointment, clobetasol 0.05% cream..

## 2023-07-08 NOTE — Assessment & Plan Note (Signed)
 Respiratory exam today unremarkable.  Checking chest x-ray of the lungs today.  She agrees.

## 2023-07-08 NOTE — Assessment & Plan Note (Signed)
 Stable, controlled per patient.  Continue to monitor.

## 2023-07-08 NOTE — Addendum Note (Signed)
 Addended by: [name] on: 07/08/2023 02:40 PM   Modules accepted: Orders

## 2023-07-08 NOTE — Progress Notes (Addendum)
 Subjective:    Patient ID: Julie Powers, female    DOB: [birth year], 33 y.o.   MRN: [id number]  HPI  Julie Powers is a very pleasant 33 y.o. female who presents today for complete physical and follow up of chronic conditions.  She's noticed a barking type sound that comes out each time she laughs. She's noticed this over the last 1 year since having COVID-19 infection 3 times.  She does experience shortness of breath at times.  She questions if she has lung damage from COVID.  She denies a history of asthma and is a non-smoker.  Immunizations: -Tetanus: Completed in 2017 -Influenza: Completed this season   Diet: Fair diet.  Exercise: Regular exercise.   Eye exam: Completed years ago  Dental exam: Completes semi-annually    Pap Smear: Completed in 2020   BP Readings from Last 3 Encounters:  07/08/23 134/86  05/24/23 (!) 135/90  01/28/22 135/88      Review of Systems  Constitutional:  Negative for unexpected weight change.  HENT:  Negative for rhinorrhea.   Respiratory:  Negative for cough and shortness of breath.   Cardiovascular:  Negative for chest pain.  Gastrointestinal:  Negative for constipation and diarrhea.  Genitourinary:  Negative for difficulty urinating and menstrual problem.  Musculoskeletal:  Negative for arthralgias and myalgias.  Skin:  Negative for rash.  Allergic/Immunologic: Negative for environmental allergies.  Neurological:  Negative for dizziness and headaches.  Psychiatric/Behavioral:  The patient is not nervous/anxious.          Past Medical History:  Diagnosis Date   Anxiety    Elevated blood pressure complicating pregnancy, antepartum 12/04/2013   Overview:  Patient had one elevated pressure today in clinic, 141/71. Repeat was 114/67.  Will order HELLP and UP/C while in clinic and send patient to labor and delivery triage for serial BP monitoring. If pressures remain elevated or labs are abnormal proceed with IOL    Encounter for  supervision of normal pregnancy in multigravida 07/07/2013   Overview:  Boy  Dating: LMP 03/06/13 c/w 20wk [place].  Delivery: anticipate SVD   Feeding: breast  Depression risk: moderate - endorses depressive symptoms following last pregnancy  Contraception: condoms  BMI: 28, advised 15-25lbs weight gain. Total weight gain: 15.967 kg (35 lb 3.2 oz).  Vaccinations: O+, Rubella immune, h/o Varicella, Tdap given   FWB:  Screening: Declined Antenatal testing: not in    Social History   Socioeconomic History   Marital status: Married    Spouse name: Not on file   Number of children: Not on file   Years of education: Not on file   Highest education level: Bachelor's degree (e.g., BA, AB, BS)  Occupational History   Not on file  Tobacco Use   Smoking status: Never   Smokeless tobacco: Never  Vaping Use   Vaping status: Never Used  Substance and Sexual Activity   Alcohol use: Yes   Drug use: No   Sexual activity: Yes    Birth control/protection: None  Other Topics Concern   Not on file  Social History Narrative   Married.   4 children.   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    Social Drivers of Corporate investment banker Strain: Low Risk  (07/08/2023)   Overall Financial Resource Strain (CARDIA)    Difficulty of Paying Living Expenses: Not hard at all  Food Insecurity: No Food Insecurity (07/08/2023)   Hunger Vital Sign    Worried About Running Out  of Food in the Last Year: Never true    Ran Out of Food in the Last Year: Never true  Transportation Needs: No Transportation Needs (07/08/2023)   PRAPARE - Administrator, Civil Service (Medical): No    Lack of Transportation (Non-Medical): No  Physical Activity: Sufficiently Active (07/08/2023)   Exercise Vital Sign    Days of Exercise per Week: 4 days    Minutes of Exercise per Session: 60 min  Stress: Stress Concern Present (07/08/2023)   [employer] of Occupational Health - Occupational Stress Questionnaire    Feeling of Stress : To  some extent  Social Connections: Moderately Integrated (07/08/2023)   Social Connection and Isolation Panel [NHANES]    Frequency of Communication with Friends and Family: More than three times a week    Frequency of Social Gatherings with Friends and Family: More than three times a week    Attends Religious Services: 1 to 4 times per year    Active Member of [employer] or Organizations: No    Attends Engineer, structural: Not on file    Marital Status: Married  Catering manager Violence: Not on file    History reviewed. No pertinent surgical history.  Family History  Problem Relation Age of Onset   Diabetes Mother    Hypertension Mother    Cancer Maternal Grandfather        lung    No Known Allergies  Current Outpatient Medications on File Prior to Visit  Medication Sig Dispense Refill   clobetasol ointment (TEMOVATE) 0.05 % Apply 1 Application topically 2 (two) times daily. Use for 2 weeks. 30 g 0   tacrolimus (PROTOPIC) 0.1 % ointment APPLY TOPICALLY 2 (TWO) TIMES DAILY. ALTERNATE USE WITH CLOBETASOL EVERY 2 WEEKS. 100 g 0   No current facility-administered medications on file prior to visit.    BP 134/86   Pulse 70   Temp 100.1 F (37.8 C) (Temporal)   Ht 5' 9.5" (1.765 m)   Wt 228 lb (103.4 kg)   LMP 06/21/2023   SpO2 99%   BMI 33.19 kg/m  Objective:   Physical Exam Exam conducted with a chaperone present.  HENT:     Right Ear: Tympanic membrane and ear canal normal.     Left Ear: Tympanic membrane and ear canal normal.  Eyes:     Pupils: Pupils are equal, round, and reactive to light.  Cardiovascular:     Rate and Rhythm: Normal rate and regular rhythm.  Pulmonary:     Effort: Pulmonary effort is normal.     Breath sounds: Normal breath sounds.  Abdominal:     General: Bowel sounds are normal.     Palpations: Abdomen is soft.     Tenderness: There is no abdominal tenderness.  Genitourinary:    Labia:        Right: No rash, tenderness or lesion.         Left: No rash, tenderness or lesion.      Vagina: Normal.     Cervix: Normal.     Uterus: Normal.      Adnexa: Right adnexa normal and left adnexa normal.  Musculoskeletal:        General: Normal range of motion.     Cervical back: Neck supple.  Skin:    General: Skin is warm and dry.  Neurological:     Mental Status: She is alert and oriented to person, place, and time.     Cranial Nerves:  No cranial nerve deficit.     Deep Tendon Reflexes:     Reflex Scores:      Patellar reflexes are 2+ on the right side and 2+ on the left side. Psychiatric:        Mood and Affect: Mood normal.           Assessment & Plan:  Preventative health care Assessment & Plan: Immunizations UTD. Pap smear due, completed today.  Discussed the importance of a healthy diet and regular exercise in order for weight loss, and to reduce the risk of further co-morbidity.  Exam stable. Labs pending.  Follow up in 1 year for repeat physical.   Orders: -     TSH -     Comprehensive metabolic panel with GFR -     CBC -     Lipid panel  Nummular eczema Assessment & Plan: Follow with dermatology. Office notes reviewed from February 2025.  Continue tacrolimus 0.1% ointment, clobetasol 0.05% cream..    GAD (generalized anxiety disorder) Assessment & Plan: Stable, controlled per patient.  Continue to monitor.   Screening for cervical cancer -     Cytology - PAP  Shortness of breath Assessment & Plan: Respiratory exam today unremarkable.  Checking chest x-ray of the lungs today.  She agrees.   Orders: -     DG Chest 2 View        [name], NP

## 2023-07-08 NOTE — Patient Instructions (Signed)
 Stop by the lab prior to leaving today. I will notify you of your results once received.   It was a pleasure to see you today!

## 2023-07-08 NOTE — Assessment & Plan Note (Signed)
Immunizations UTD. Pap smear due, completed today.  Discussed the importance of a healthy diet and regular exercise in order for weight loss, and to reduce the risk of further co-morbidity.  Exam stable. Labs pending.  Follow up in 1 year for repeat physical.  

## 2023-07-09 DIAGNOSIS — Z0000 Encounter for general adult medical examination without abnormal findings: Secondary | ICD-10-CM

## 2023-07-09 LAB — LIPID PANEL
Cholesterol: 184 mg/dL (ref 0–200)
HDL: 73.4 mg/dL (ref >39.00)
LDL Cholesterol: 99 mg/dL (ref 0–99)
NonHDL: 110.82
Total CHOL/HDL Ratio: 3
Triglycerides: 60 mg/dL (ref 0.0–149.0)
VLDL: 12 mg/dL (ref 0.0–40.0)

## 2023-07-09 LAB — COMPREHENSIVE METABOLIC PANEL WITH GFR
ALT: 16 U/L (ref 0–35)
AST: 21 U/L (ref 0–37)
Albumin: 4.3 g/dL (ref 3.5–5.2)
Alkaline Phosphatase: 35 U/L — ABNORMAL LOW (ref 39–117)
BUN: 14 mg/dL (ref 6–23)
CO2: 26 meq/L (ref 19–32)
Calcium: 9.2 mg/dL (ref 8.4–10.5)
Chloride: 103 meq/L (ref 96–112)
Creatinine, Ser: 0.86 mg/dL (ref 0.40–1.20)
GFR: 88.58 mL/min (ref >60.00)
Glucose, Bld: 87 mg/dL (ref 70–99)
Potassium: 3.8 meq/L (ref 3.5–5.1)
Sodium: 136 meq/L (ref 135–145)
Total Bilirubin: 0.4 mg/dL (ref 0.2–1.2)
Total Protein: 6.8 g/dL (ref 6.0–8.3)

## 2023-07-09 LAB — CBC
HCT: 39.3 % (ref 36.0–46.0)
Hemoglobin: 12.8 g/dL (ref 12.0–15.0)
MCHC: 32.5 g/dL (ref 30.0–36.0)
MCV: 84.9 fl (ref 78.0–100.0)
Platelets: 252 10*3/uL (ref 150.0–400.0)
RBC: 4.63 Mil/uL (ref 3.87–5.11)
RDW: 12.8 % (ref 11.5–15.5)
WBC: 5.7 10*3/uL (ref 4.0–10.5)

## 2023-07-09 LAB — TSH: TSH: 1.5 u[IU]/mL (ref 0.35–5.50)

## 2023-07-12 DIAGNOSIS — M546 Pain in thoracic spine: Secondary | ICD-10-CM | POA: Diagnosis not present

## 2023-07-13 LAB — CYTOLOGY - PAP
Comment: NEGATIVE
Diagnosis: NEGATIVE
High risk HPV: NEGATIVE

## 2023-07-20 ENCOUNTER — Other Ambulatory Visit: Payer: Self-pay | Admitting: Dermatology

## 2023-07-20 DIAGNOSIS — L300 Nummular dermatitis: Secondary | ICD-10-CM

## 2023-07-20 DIAGNOSIS — L280 Lichen simplex chronicus: Secondary | ICD-10-CM

## 2023-07-28 DIAGNOSIS — M546 Pain in thoracic spine: Secondary | ICD-10-CM | POA: Diagnosis not present

## 2023-12-07 ENCOUNTER — Ambulatory Visit: Payer: Federal, State, Local not specified - PPO | Admitting: Dermatology

## 2024-01-03 ENCOUNTER — Other Ambulatory Visit (INDEPENDENT_AMBULATORY_CARE_PROVIDER_SITE_OTHER)

## 2024-01-03 ENCOUNTER — Ambulatory Visit: Payer: Self-pay | Admitting: Primary Care

## 2024-01-03 DIAGNOSIS — Z0000 Encounter for general adult medical examination without abnormal findings: Secondary | ICD-10-CM | POA: Diagnosis not present

## 2024-01-03 LAB — BASIC METABOLIC PANEL WITH GFR
BUN: 11 mg/dL (ref 6–23)
CO2: 28 meq/L (ref 19–32)
Calcium: 9.2 mg/dL (ref 8.4–10.5)
Chloride: 104 meq/L (ref 96–112)
Creatinine, Ser: 0.8 mg/dL (ref 0.40–1.20)
GFR: 96.28 mL/min (ref >60.00)
Glucose, Bld: 91 mg/dL (ref 70–99)
Potassium: 3.8 meq/L (ref 3.5–5.1)
Sodium: 137 meq/L (ref 135–145)

## 2024-01-03 LAB — HEMOGLOBIN A1C: Hgb A1c MFr Bld: 5.5 % (ref 4.6–6.5)

## 2024-01-14 DIAGNOSIS — M546 Pain in thoracic spine: Secondary | ICD-10-CM | POA: Diagnosis not present

## 2024-05-19 ENCOUNTER — Ambulatory Visit: Payer: Self-pay | Admitting: Primary Care

## 2024-05-19 ENCOUNTER — Ambulatory Visit: Admitting: Primary Care

## 2024-05-19 VITALS — BP 126/64 | HR 88 | Temp 98.3°F | Ht 69.5 in | Wt 222.5 lb

## 2024-05-19 DIAGNOSIS — R5382 Chronic fatigue, unspecified: Secondary | ICD-10-CM | POA: Insufficient documentation

## 2024-05-19 DIAGNOSIS — M79604 Pain in right leg: Secondary | ICD-10-CM

## 2024-05-19 DIAGNOSIS — R6889 Other general symptoms and signs: Secondary | ICD-10-CM | POA: Insufficient documentation

## 2024-05-19 LAB — COMPREHENSIVE METABOLIC PANEL WITH GFR
ALT: 13 U/L (ref 3–35)
AST: 15 U/L (ref 5–37)
Albumin: 4.4 g/dL (ref 3.5–5.2)
Alkaline Phosphatase: 41 U/L (ref 39–117)
BUN: 14 mg/dL (ref 6–23)
CO2: 29 meq/L (ref 19–32)
Calcium: 9.4 mg/dL (ref 8.4–10.5)
Chloride: 104 meq/L (ref 96–112)
Creatinine, Ser: 0.9 mg/dL (ref 0.40–1.20)
GFR: 83.37 mL/min
Glucose, Bld: 90 mg/dL (ref 70–99)
Potassium: 4.1 meq/L (ref 3.5–5.1)
Sodium: 139 meq/L (ref 135–145)
Total Bilirubin: 0.7 mg/dL (ref 0.2–1.2)
Total Protein: 7.1 g/dL (ref 6.0–8.3)

## 2024-05-19 LAB — SEDIMENTATION RATE: Sed Rate: 10 mm/h (ref 0–20)

## 2024-05-19 LAB — TSH: TSH: 2.26 u[IU]/mL (ref 0.35–5.50)

## 2024-05-19 LAB — VITAMIN D 25 HYDROXY (VIT D DEFICIENCY, FRACTURES): VITD: 27.4 ng/mL — ABNORMAL LOW (ref 30.00–100.00)

## 2024-05-19 LAB — CORTISOL: Cortisol, Plasma: 11.5 ug/dL

## 2024-05-19 LAB — C-REACTIVE PROTEIN: CRP: <0.5 mg/dL — ABNORMAL LOW (ref 1.0–20.0)

## 2024-05-19 LAB — VITAMIN B12: Vitamin B-12: 924 pg/mL — ABNORMAL HIGH (ref 211–911)

## 2024-05-19 NOTE — Patient Instructions (Signed)
 Stop by the lab prior to leaving today. I will notify you of your results once received.   It was a pleasure to see you today!

## 2024-05-19 NOTE — Assessment & Plan Note (Signed)
 Secondary to symptoms as mentioned in HPI. Consider low-dose gabapentin labs are unremarkable.

## 2024-05-19 NOTE — Progress Notes (Signed)
 "  Subjective:    Patient ID: Julie Powers, female    DOB: 04-15-1990, 34 y.o.   MRN: [id number]  Julie Powers is a very pleasant 34 y.o. female with history of GAD, PMS, who presents today to discuss cold intolerance and fatigue.  Symptom onset years ago with cold sensation to the hands, feet with lower extremity aching. No matter how much sleep she gets she feels tired. She wakes up feeling tired. She goes to bed around 9:30 pm, has difficulty falling asleep due to discomfort. She does notice difficulty getting her legs comfortable. She doesn't feel that she sleeps well due to hand and toe numbness and cold. She sleeps with a heated blanket and a heater near her bed. She has a hard time getting warm. She does notice blanching of her fingertips and toes which mostly happens in the office or during winter months. She's not been told she snores.   She's ben taking a multivitamin with B12 and iron, zinc, vitamin C for about 6 months. She's tried Unisom a few times which doesn't help much.   She has no personal history of hypothyroidism.  TSH in March 2025 was 1.50, TSH from June 2023 was 2.50.    Review of Systems  Constitutional:  Positive for fatigue.  Endocrine: Positive for cold intolerance.  Neurological:  Positive for numbness.  Psychiatric/Behavioral:  Positive for sleep disturbance.          Past Medical History:  Diagnosis Date   Anxiety    Elevated blood pressure complicating pregnancy, antepartum 12/04/2013   Overview:  Patient had one elevated pressure today in clinic, 141/71. Repeat was 114/67.  Will order HELLP and UP/C while in clinic and send patient to labor and delivery triage for serial BP monitoring. If pressures remain elevated or labs are abnormal proceed with IOL    Encounter for supervision of normal pregnancy in multigravida 07/07/2013   Overview:  Boy  Dating: LMP 03/06/13 c/w 20wk [place] .  Delivery: anticipate SVD   Feeding: breast  Depression risk: moderate -  endorses depressive symptoms following last pregnancy  Contraception: condoms  BMI: 28, advised 15-25lbs weight gain. Total weight gain: 15.967 kg (35 lb 3.2 oz).  Vaccinations: O+, Rubella immune, h/o Varicella, Tdap given   FWB:  Screening: Declined Antenatal testing: not in    Social History   Socioeconomic History   Marital status: Married    Spouse name: Not on file   Number of children: Not on file   Years of education: Not on file   Highest education level: Bachelor's degree (e.g., BA, AB, BS)  Occupational History   Not on file  Tobacco Use   Smoking status: Never   Smokeless tobacco: Never  Vaping Use   Vaping status: Never Used  Substance and Sexual Activity   Alcohol use: Yes   Drug use: No   Sexual activity: Yes    Birth control/protection: None  Other Topics Concern   Not on file  Social History Narrative   Married.   4 children.   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    Social Drivers of Health   Tobacco Use: Low Risk (05/19/2024)   Patient History    Smoking Tobacco Use: Never    Smokeless Tobacco Use: Never    Passive Exposure: Not on file  Financial Resource Strain: Low Risk (05/19/2024)   Overall Financial Resource Strain (CARDIA)    Difficulty of Paying Living Expenses: Not hard at all  Food Insecurity: No  Food Insecurity (05/19/2024)   Epic    Worried About Programme Researcher, Broadcasting/film/video in the Last Year: Never true    Ran Out of Food in the Last Year: Never true  Transportation Needs: No Transportation Needs (05/19/2024)   Epic    Lack of Transportation (Medical): No    Lack of Transportation (Non-Medical): No  Physical Activity: Sufficiently Active (05/19/2024)   Exercise Vital Sign    Days of Exercise per Week: 4 days    Minutes of Exercise per Session: 60 min  Stress: Stress Concern Present (05/19/2024)   [employer] of Occupational Health - Occupational Stress Questionnaire    Feeling of Stress: Rather much  Social Connections: Moderately Isolated (05/19/2024)   Social  Connection and Isolation Panel    Frequency of Communication with Friends and Family: More than three times a week    Frequency of Social Gatherings with Friends and Family: Twice a week    Attends Religious Services: Never    Database Administrator or Organizations: No    Attends Engineer, Structural: Not on file    Marital Status: Married  Catering Manager Violence: Not on file  Depression (PHQ2-9): Low Risk (05/19/2024)   Depression (PHQ2-9)    PHQ-2 Score: 2  Alcohol Screen: Low Risk (05/19/2024)   Alcohol Screen    Last Alcohol Screening Score (AUDIT): 1  Housing: Unknown (05/19/2024)   Epic    Unable to Pay for Housing in the Last Year: No    Number of Times Moved in the Last Year: Not on file    Homeless in the Last Year: No  Utilities: Not on file  Health Literacy: Not on file    History reviewed. No pertinent surgical history.  Family History  Problem Relation Age of Onset   Diabetes Mother    Hypertension Mother    Lung cancer Maternal Grandfather     Allergies[1]  Medications Ordered Prior to Encounter[2]  BP 126/64   Pulse 88   Temp 98.3 F (36.8 C) (Oral)   Ht 5' 9.5 (1.765 m)   Wt 222 lb 8 oz (100.9 kg)   LMP 05/18/2024   SpO2 98%   BMI 32.39 kg/m  Objective:   Physical Exam Cardiovascular:     Rate and Rhythm: Normal rate and regular rhythm.  Pulmonary:     Effort: Pulmonary effort is normal.     Breath sounds: Normal breath sounds.  Musculoskeletal:     Cervical back: Neck supple.  Skin:    General: Skin is warm and dry.  Neurological:     Mental Status: She is alert and oriented to person, place, and time.  Psychiatric:        Mood and Affect: Mood normal.     Physical Exam        Assessment & Plan:  Cold intolerance Assessment & Plan: Differentials include autoimmune disorder, metabolic disorder, Raynaud's phenomenon.  Checking labs today including TSH, CMP, B12, vitamin D , CRP, sed rate, RF, antiCCP., cortisol.  Await  results.       Orders: -     Vitamin B12 -     VITAMIN D  25 Hydroxy (Vit-D Deficiency, Fractures) -     Comprehensive metabolic panel with GFR -     Sedimentation rate -     C-reactive protein -     TSH -     Cortisol  Chronic fatigue Assessment & Plan: Secondary to symptoms as mentioned in HPI. Consider low-dose gabapentin labs  are unremarkable.  Orders: -     Vitamin B12 -     VITAMIN D  25 Hydroxy (Vit-D Deficiency, Fractures) -     Comprehensive metabolic panel with GFR -     Sedimentation rate -     C-reactive protein -     TSH -     Cortisol  Pain in both lower extremities -     Rheumatoid factor -     Cyclic citrul peptide antibody, IgG    Assessment and Plan Assessment & Plan         Comer MARLA Gaskins, NP       [1] No Known Allergies [2]  Current Outpatient Medications on File Prior to Visit  Medication Sig Dispense Refill   Cholecalciferol (VITAMIN D3 PO) Take by mouth daily.     Multiple Vitamins-Iron (MULTIVITAMIN/IRON PO) Take by mouth daily.     WEGOVY 1.5 MG tablet Take 1.5 mg by mouth every morning.     No current facility-administered medications on file prior to visit.   "

## 2024-05-19 NOTE — Assessment & Plan Note (Signed)
 Differentials include autoimmune disorder, metabolic disorder, Raynaud's phenomenon.  Checking labs today including TSH, CMP, B12, vitamin D , CRP, sed rate, RF, antiCCP., cortisol.  Await results.
